# Patient Record
Sex: Female | Born: 1935 | Race: White | Hispanic: No | Smoking: Never smoker
Health system: Southern US, Community
[De-identification: ages and names within clinical notes are randomized; demographics above are authoritative.]

## PROBLEM LIST (undated history)

## (undated) DIAGNOSIS — D649 Anemia, unspecified: Secondary | ICD-10-CM

## (undated) DIAGNOSIS — T4145XA Adverse effect of unspecified anesthetic, initial encounter: Secondary | ICD-10-CM

## (undated) DIAGNOSIS — M1990 Unspecified osteoarthritis, unspecified site: Secondary | ICD-10-CM

## (undated) DIAGNOSIS — T8859XA Other complications of anesthesia, initial encounter: Secondary | ICD-10-CM

## (undated) DIAGNOSIS — I10 Essential (primary) hypertension: Secondary | ICD-10-CM

## (undated) DIAGNOSIS — E039 Hypothyroidism, unspecified: Secondary | ICD-10-CM

## (undated) DIAGNOSIS — N189 Chronic kidney disease, unspecified: Secondary | ICD-10-CM

## (undated) DIAGNOSIS — I219 Acute myocardial infarction, unspecified: Secondary | ICD-10-CM

## (undated) DIAGNOSIS — K219 Gastro-esophageal reflux disease without esophagitis: Secondary | ICD-10-CM

## (undated) HISTORY — PX: CARDIAC SURGERY: SHX584

## (undated) HISTORY — PX: CARDIAC VALVE REPLACEMENT: SHX585

## (undated) HISTORY — PX: CARDIAC CATHETERIZATION: SHX172

## (undated) HISTORY — PX: CERVICAL DISCECTOMY: SHX98

## (undated) HISTORY — PX: BACK SURGERY: SHX140

---

## 2002-10-13 ENCOUNTER — Ambulatory Visit (HOSPITAL_COMMUNITY): Admission: RE | Admit: 2002-10-13 | Discharge: 2002-10-13 | Payer: Self-pay | Admitting: Gastroenterology

## 2004-04-04 ENCOUNTER — Encounter: Admission: RE | Admit: 2004-04-04 | Discharge: 2004-04-04 | Payer: Self-pay | Admitting: Internal Medicine

## 2004-04-18 ENCOUNTER — Encounter: Admission: RE | Admit: 2004-04-18 | Discharge: 2004-04-18 | Payer: Self-pay | Admitting: Internal Medicine

## 2004-05-21 ENCOUNTER — Encounter: Admission: RE | Admit: 2004-05-21 | Discharge: 2004-05-21 | Payer: Self-pay | Admitting: Internal Medicine

## 2004-09-05 ENCOUNTER — Inpatient Hospital Stay (HOSPITAL_COMMUNITY): Admission: RE | Admit: 2004-09-05 | Discharge: 2004-09-12 | Payer: Self-pay | Admitting: Cardiothoracic Surgery

## 2004-09-27 ENCOUNTER — Encounter: Admission: RE | Admit: 2004-09-27 | Discharge: 2004-09-27 | Payer: Self-pay | Admitting: Cardiothoracic Surgery

## 2004-10-11 ENCOUNTER — Inpatient Hospital Stay (HOSPITAL_COMMUNITY): Admission: AD | Admit: 2004-10-11 | Discharge: 2004-10-24 | Payer: Self-pay | Admitting: Cardiothoracic Surgery

## 2004-10-12 ENCOUNTER — Encounter: Payer: Self-pay | Admitting: Cardiovascular Disease

## 2004-10-12 ENCOUNTER — Ambulatory Visit: Payer: Self-pay | Admitting: Cardiovascular Disease

## 2004-10-14 ENCOUNTER — Ambulatory Visit: Payer: Self-pay | Admitting: Hematology & Oncology

## 2004-10-15 ENCOUNTER — Encounter: Payer: Self-pay | Admitting: Cardiology

## 2004-10-16 ENCOUNTER — Encounter (INDEPENDENT_AMBULATORY_CARE_PROVIDER_SITE_OTHER): Payer: Self-pay | Admitting: *Deleted

## 2004-11-29 ENCOUNTER — Encounter: Admission: RE | Admit: 2004-11-29 | Discharge: 2004-11-29 | Payer: Self-pay | Admitting: Cardiothoracic Surgery

## 2005-11-14 ENCOUNTER — Encounter: Admission: RE | Admit: 2005-11-14 | Discharge: 2005-11-14 | Payer: Self-pay | Admitting: Internal Medicine

## 2010-02-01 ENCOUNTER — Encounter: Admission: RE | Admit: 2010-02-01 | Discharge: 2010-02-01 | Payer: Self-pay | Admitting: Internal Medicine

## 2011-01-04 NOTE — Op Note (Signed)
   NAME:  [name], [name]                          ACCOUNT NO.:  [id number]   MEDICAL RECORD NO.:  [id number]                   PATIENT TYPE:  AMB   LOCATION:  ENDO                                 FACILITY:  [facility]   PHYSICIAN:  Anselmo Rod, M.D.               DATE OF BIRTH:  1932-08-05   DATE OF PROCEDURE:  DATE OF DISCHARGE:                                 OPERATIVE REPORT   PROCEDURE PERFORMED:  Screening colonoscopy   ENDOSCOPIST:  Anselmo Rod, M.D.   INSTRUMENT USED:  Pediatric adjustable Olympus colonoscope.   INDICATIONS FOR PROCEDURE:  The patient is a 77 year old white female  undergoing screening colonoscopy. The patient has a previous history of  anemia. Rule out colonic polyps, masses, etc.   PREPROCEDURE PREPARATION:  Informed consent was secured from the patient.  The patient was fasted for eight hours prior to  the procedure and was  prepped with a bottle of MiraLax and Gatorade the night prior to the  procedure.   PREPROCEDURE PHYSICAL:  The patient had stable vital signs. Neck was supple.  Chest clear to auscultation. S1, S2 regular. Abdomen soft with normal bowel  sounds.   DESCRIPTION OF PROCEDURE:  The patient was placed in the left lateral  decubitus position and sedated with 50 mg of Demerol and 5 mg of Versed  intravenously. Once the patient was adequately sedated and maintained on low  flow oxygen  and continuous cardiac monitoring, the Olympus video  colonoscope was advanced into the rectum to the cecum with difficulty.   The patient had a healthy appearing colon with no evidence of masses,  polyps, diverticulosis, erosions or ulcerations. The appendiceal orifice and  the ileocecal valve were clearly visualized and photographed and no  abnormalities were noted for the colonic mucosa. A small internal hemorrhoid  was seen on retroflexion into the rectum.   IMPRESSION:  1. Normal appearing colon up to the cecum.  2. Small nonbleeding internal  hemorrhoid seen on retroflexion.   RECOMMENDATIONS:  1. A high-fiber diet with liberal fluid intake has been advocated.  2.     Repeat colorectal cancer screening is recommended in the next 10 years     unless the patient develops any abnormal symptoms in the interim.  3. The patient is to follow up on a p.r.n. basis.                                               Anselmo Rod, M.D.    JNM/MEDQ  D:  10/13/2002  T:  10/13/2002  Job:  [id number]   cc:   Ralene Ok, M.D.  [address]  [place]  [place] [address]  Fax: [phone]

## 2011-01-04 NOTE — Op Note (Signed)
[name], Nancy                ACCOUNT NO.:  [id number]   MEDICAL RECORD NO.:  [id number]          PATIENT TYPE:  INP   LOCATION:  2314                         FACILITY:  [facility]   PHYSICIAN:  Bedelia Person, M.D.        DATE OF BIRTH:  Dec 17, 1931   DATE OF PROCEDURE:  09/05/2004  DATE OF DISCHARGE:                                 OPERATIVE REPORT   PROCEDURE:  Transesophageal echocardiography.   OPERATOR:  Bedelia Person, M.D.   ANESTHESIA:  General.   INDICATIONS FOR PROCEDURE:  This is a 77 year old with known significant  mitral regurgitation, scheduled at that time for mitral valve replacement or  repair.  The patient was questioned about possible esophageal disorders, and  denies esophageal or gastric disorders in the past.  The TEE will be used  intraoperatively to assess repair or replacement.   DESCRIPTION OF PROCEDURE:  After induction with general anesthesia, the  airway was secured with an oral endotracheal tube.  The stomach was  suctioned with an orogastric tube which was then removed.  The  transesophageal transducer was heavily lubricated, passed in a sleeve,  sleeve was lubricated and sleeve and transducer were passed blindly down the  oropharynx without difficulty, to the 40 cm mark.  There it remained and was  manipulated throughout the remainder of the case, to obtain various  Omniplane views.   At the completion of the case, the probe and sleeve were removed, there was  no evidence for oropharyngeal damage or facial tattooing.   PRE-BYPASS EXAMINATION:  Left ventricle hyperdynamic, there were no  segmental defects.  The left atrium slightly enlarged, the appendage was  clean. The atrioseptum showed a small, left to right regurgitant jet into  the right atrium suggestive of PFO.  The mitral valve showed gross prolapse  of the middle portion of the anterior leaflet.  There was a free cord noted  off this segment.  Posterior leaflet appeared normal. Color Doppler  revealed  4+ mitral regurgitation with a broad, regurgitant flow jet.  Flow reversal  could be documented in the left pulmonary vein.   The aortic valve had 3 leaflets, they opened and closed appropriately. There  was a single area of calcification.  There was no significant aortic  insufficiency.   Patient was placed on cardiopulmonary bypass and underwent mitral valve  replacement.  At the completion of the bypass, numerous air bubbles were  noted which were removed with a left ventricular vent.  The post-bypass exam  showed the left ventricle to initially show some hypokinesis of the entire  septal wall. This improved over a short time after bypass to its normal,  hyperdynamic, pre-bypass state. There were no segmental defects.   The left atrium and septum:  The appendage was no longer visible, which was  probably tied off during surgery. The left to right shunt had resolved with  sutures in the septum noted.  The mitral valve had a ring which was well  seated.  Sutures were intact.  The leaflets appeared to be coapting well.  There was a small,  free segment flailing off the mid-anterior leaflet. The  anterior leaflet appeared significantly smaller than the pre-bypass  examination. Color Doppler  showed turbulent flow above the posterior leaflet in the area of the absent  appendage, and in the area of the pulmonary vein, which was probably  secondary to dynamic flow.  There was a trace, central regurgitant flow  through the mitral valve.  The aortic valve was unchanged from pre-bypass  examination.       LK/MEDQ  D:  09/05/2004  T:  09/05/2004  Job:  [id number]   cc:   Sheliah Plane, MD  [address]  [place]  [place] [address]   Judithe Modest, Dr.

## 2011-01-04 NOTE — Consult Note (Signed)
[name], [name]                ACCOUNT NO.:  [id number]   MEDICAL RECORD NO.:  [id number]          PATIENT TYPE:  INP   LOCATION:  2022                         FACILITY:  [facility]   PHYSICIAN:  Rose Phi. Myna Hidalgo, M.D. 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   HISTORY OF PRESENT ILLNESS:  [name] is a very nice 77 year old white  female who was discharged back on September 12, 2004 by Dr. Tyrone Sage.  She is  diagnosed with severe mitral valve regurgitation.  Subsequently underwent  repair of mitral valve with a Gore-Tex wing, with Gore-Tex Cordis.   There was no problems postoperatively from Dr. Dennie Maizes point of view.  She had done well.  On September 11, 2004, a CBC showed a white cell count of  6.7, hemoglobin 11.6, hematocrit 34.1 and platelet count was 241.  She was  on Coumadin as an outpatient.   She went in to see Dr. Tyrone Sage.  She was complaining of weakness.  She  apparently had jaundice.  She was seen in his office.  She subsequently  was admitted on October 11, 2004.   On admission, CBC showed a white cell count of 8.5, hemoglobin 5.3,  hematocrit 15 and platelet count 215.  Metabolic panel showed a bilirubin of  2.5.  Albumin 15, creatinine 1.4.  LDH was 1956.  A reticulocyte was done,  which was 10%.  Haptoglobin was less than 6.  She had a Coombs test which  was negative.   She was given two units of packed red blood cells.  On October 12, 2004,  CBC shows a white cell count 8.7, hemoglobin 8.3, hematocrit 23.5, platelet  count 211.  A urinalysis was done, which did show large amount of bilirubin  and blood.  She was positive for nitrates and leukocytes.  She had a  prothrombin time of 20.1 today.   We were asked to see her because  of hemolysis.  She had a blood smear  reviewed; which was consistent with a microangiopathic process, with  numerous schistocytes seen.   She has not had any headache or blurred vision.  There has been no bony  pain.  She has had no diarrhea.  She has not had any unusual food.  She has  had no bleeding otherwise.   PAST MEDICAL HISTORY:  1.  Hypertension.  2.  Hyperlipidemia.  3.  Hypothyroidism.   ALLERGIES:  None.   ADMISSION MEDICATIONS:  1.  Aspirin 81 mg p.o. q.d.  2.  Micardis 80 mg q.d.  3.  Pravachol 40 mg q.d.  4.  Synthroid 0.05 mg q.d.  5.  Coumadin 2 mg __________ and 2.5 mg q.d.  6.  Folic acid.   SOCIAL HISTORY:  Negative for tobacco or alcohol use.  She had no obvious  occupational exposures.   FAMILY HISTORY:  Unremarkable for any type  of anemia or blood problem.   PHYSICAL EXAMINATION:  GENERAL:  She is a fairly well developed, well  nourished white female; in no obvious distress.  VITAL SIGNS:  Temperature 98.1, pulse 68, respiratory rate 20, blood  pressure 144/60.  HEENT:  Shows normocephalic and atraumatic skull.  I really cannot detect  any scleral icterus.  Conjunctivae was slightly pale.  Oral lesions were not  noted.  NECK:  There is no adenopathy in the neck.  LUNGS:  Clear bilaterally.  CARDIAC:  Regular rate and rhythm with a 3/6 systolic  ejection murmur.  This is best heard over the apex.  ABDOMEN:  Shows a soft abdomen with good bowel sounds. There is no palpable  abdominal mass.  There is no palpable hepatosplenomegaly.  BACK:  Shows no tenderness over the spine, ribs or hips.  EXTREMITIES:  Shows no clubbing, cyanosis or edema.  NEUROLOGIC:  Shows no focal neurological deficits.   IMPRESSION:  [name] is a 77 year old female with recent mitral valve  repair.  She now has severe hemolytic anemia.  This is non-immune.  Therefore, one has to highly suspect this being mitral valve repair.  There  are very few etiologies for non  autoimmune hemolysis.  She certainly does  not have TTP or HUS.  There is no evidence to suggest any type of infectious  etiology; she is not septic.  There is no evidence to support GIC.  She has  no evidence to support PNH.  She does not have any obvious known enzyme  deficit, which can lead to hemolysis.  She is not on any medication that  would lead to hemolysis, due to enzyme deficiency (i.e., G6PD deficiency,  etc).   Unfortunately I think there is little that can be done in this situation.  As long as the insult is present, then hemolysis will continue.  The best  thing we can do is to keep her on folic acid.  She will certainly need to be  transfused.  Would probably try to reverse her anticoagulation if possible,  and then put her on some type of a low dose anticoagulation.   She will have to her CBC followed daily, and transfuse as needed.   Will follow her along.  Thanks for allowing [place] to see [name].  She is a  nice lady.      PRE/MEDQ  D:  10/12/2004  T:  10/14/2004  Job:  [id number]

## 2011-01-04 NOTE — Op Note (Signed)
[name], [name] NO.:  [id number]   MEDICAL RECORD NO.:  [id number]          PATIENT TYPE:  INP   LOCATION:  2312                         FACILITY:  [facility]   PHYSICIAN:  Sheliah Plane, MD    DATE OF BIRTH:  1932/05/20   DATE OF PROCEDURE:  10/16/2004  DATE OF DISCHARGE:                                 OPERATIVE REPORT   PREOPERATIVE DIAGNOSIS:  Recent mitral valve repair with recurrent mitral  regurgitation and hemolysis.   POSTOPERATIVE DIAGNOSIS:  Recent mitral valve repair with recurrent mitral  regurgitation and hemolysis.  Flail of portion of the anterior leaflet  secondary to ruptured artificial cords and partial dehiscence of the mitral  ring.   PROCEDURE:  Redo sternotomy and mitral valve replacement with a [employer] pericardial tissue valve 29 mm, model 6900T, serial number [id number].   SURGEON:  Sheliah Plane, M.D.   BRIEF HISTORY:  Patient is a 77 year old female who, approximately five  before, was noted to have normal coronary arteries with severe mitral  regurgitation.  At the time of surgery, the patient had repair of the  anterior leaflet with placement of Gore-Tex cords three pairs, and had a  repair without significant mitral regurgitation.  She initially did well  after surgery and was discharged.  The week prior to return to the hospital,  began feeling increasing weak and short of breath.  Evaluation showed  elevated bilirubin and hemoglobin had fallen to 5 with schistocytes present.  Laboratory worked up for hemolysis confirmed some mechanical hemolysis.  Echocardiogram  and TEE were performed and the TEE confirmed recurrent  severe mitral regurgitation.  Because of the hemolysis and regurgitation, it  was presumed that the patient had breakdown of the mitral repair and  reoperation was recommended.  Patient agreed and signed informed consent.   DESCRIPTION OF PROCEDURE:  With Swan-Ganz and arterial line monitors in  place, the patient underwent general endotracheal anesthesia without  incident.  Skin of the chest and legs was prepped with Betadine and draped  in usual sterile manner.  The previous incision was reopened as was the  sternum divided.  The sternum was well healed.  The underlying tissue was  dissected off the sternum.  This was particularly difficult because of the  amount of inflammatory response especially around the anterior right  ventricle and the right atrium and ascending aorta.  With dissection in this  area completed, the patient was systemically heparinized.  The ascending  aorta was cannulated, superior and internal vena caval cannulas were placed.  Metal tip cannula was placed directly into the superior vena cava and into  the inferior vena cava.  A retrograde cardioplegia catheter was placed.  Ascending aorta root bent cardioplegia needle was introduced.  Patient was  placed on cardiopulmonary bypass.  Aortic crossclamp was applied, 500 mL of  cold blood potassium  cardioplegia was administered antegrade.  In addition,  intermittently, retrograde cardioplegia was used.  Intra-atrial groove was  opened with proper retraction.  The valve was inspected.  There was a  partial dehiscence of the valve  ring over one stitch just above the  posterior commissure.  One of the three artificial cordae was dehisced.  In  addition, these cords were removed.  Each of the three cords were carefully  inspected and the small pledgets that had been used to place these in the  __________ muscles were well seated and tied in place so we did not make an  attempt to remove them.  It was decided that considering the patient's age  and reoperation that rather than attempting a second repair, that the valve  would be replaced with a pericardial tissue valve.  The anterior leaflet was  excised.  The posterior leaflet was preserved.  The annulus was sized for a  #29 pericardial tissue valve.  A #2 Ticron  pledgeted suture placed  circumferentially through the annulus.  Valve was then secured in place  taking care to place the valve in a way that the struts would not interfere  with the outflow tract.  The valve was secured in place and seated well.  The patient's body temperature was rewarmed.  The heart was allowed to  passively fill and deair as the left atriotomy was closed with a running 3-0  Prolene.  The aortic crossclamp was removed with a total crossclamp time of  71 minutes.  The patient spontaneously converted to a sinus rhythm with  removal of the crossclamp. The remained hemodynamically stable.  With the  transesophageal echocardiogram, it was obvious that the mitral valve was  without leakage and it was well seated.  With the patient's body temperature  rewarmed to 37 degrees, she was then ventilated and weaned from  cardiopulmonary bypass  without difficulty.  She remained hemodynamically  stable, was decannulated in the usual fashion.  Protamine sulfate was  administered.  With the operative field hemostatic after administration of  platelets and fresh frozen because the patient had a prolonged bleeding time  and slightly prolonged protime preoperatively.  The patient remained  hemodynamically stable.  Two mediastinal tubes were left in place.  The  sternum was closed with #6 stainless steel wires.  Fascial closed with  interrupted 0 Vicryl, running 3-0 Vicryl in subcutaneous tissue, 4-0  subcuticular stitch in skin edges.  Dry dressings were applied.  Sponge and  needle counts were reported as correct at the completion of the procedure.  The patient tolerated the procedure without obvious complications and was  transferred to the surgical intensive care unit for further postoperative  care.  Total pump time was 92 minutes.  The patient did require banked red  blood cells during the procedure because of the low hematocrit while on bypass.  The patient tolerated the procedure  without obvious complications  and was transferred to surgical intensive care unit for further  postoperative care.  Pericardial tissue valve implanted was model 6900T,  serial number [id number], 29 mm [employer].      EG/MEDQ  D:  10/16/2004  T:  10/16/2004  Job:  [id number]   cc:   Dr. Thomes Lolling  [facility]  [place], [place]

## 2011-01-04 NOTE — Op Note (Signed)
[name], [name]                ACCOUNT NO.:  [id number]   MEDICAL RECORD NO.:  [id number]          PATIENT TYPE:  INP   LOCATION:  2314                         FACILITY:  [facility]   PHYSICIAN:  Sheliah Plane, MD    DATE OF BIRTH:  [birth year]   DATE OF PROCEDURE:  09/05/2004  DATE OF DISCHARGE:                                 OPERATIVE REPORT   PREOPERATIVE DIAGNOSIS:  Severe mitral regurgitation.   POSTOPERATIVE DIAGNOSIS:  Severe mitral regurgitation.   SURGICAL PROCEDURE:  Repair of mitral valve with placement of Gore-Tex  Cordis on metal scallop of the anterior leaflet and placement of a mitral  valve annuloplasty ring, [employer], Model [id number], serial [id number].  Also, sutured closure of left atrial appendage and closure of patent foramen  ovale.   SURGEON:  Sheliah Plane, MD   BRIEF HISTORY:  The patient is a 77 year old female who presented with  several months of increasing shortness of breath and a harsh murmur of  mitral insufficiency.  She was evaluated by Dr. Thomes Lolling in [facility],  including TEE and cardiac catheterization, which demonstrated a flail  portion of the anterior leaflet of the mitral valve, preserved LV function.  Coronary arteries were without significant disease.  When initially seen,  the patient had some dental infections.  She was seen and evaluated by  dentistry and ultimately, these were cleared up and patient was cleared to  proceed with surgery.  Mitral valve repair or replacement had been  recommended to the patient, who agreed and signed informed consent.   DESCRIPTION OF PROCEDURE:  With Swan-Ganz and arterial lines in place, the  patient underwent general endotracheal anesthesia without incidence.  The  skin, legs, and chest prepped with Betadine and draped in the usual sterile  manner.  A transesophageal echo probe was placed.  A separate dictation  describes this, but as the preop echo showed, patient had severe mitral  regurgitation with flail portion of the anterior leaflet.  The posterior  leaflet appeared to be intact. Also confirmed was a patent foramen ovale.  Patient was in sinus rhythm.  A median sternotomy was performed.  The  patient was systemically heparinized.  The ascending aorta was cannulated.  Superior and inferior vena caval cannulae were placed.  Tapes were placed  around the superior and inferior vena cavae.  During the process of  cannulation, the patient had episodic atrial fibrillation, and because of  the risks of postop atrial fibrillation, was started on an amiodarone drip.  Patient is placed on cardiopulmonary bypass at 2.4 L/min/m2.  Patient's body  temperature is cooled to 30 degrees.  An aortic root bent-cardioplegic  needle was placed into the ascending aorta.  Retrograde cardioplegia  catheter was also placed through a separate opening in the right atrium.  Aortic cross clamp was applied, and 500 cc of cold blood cardioplegia was  administered antegrade.  The atrium was then opened along the intra-atrial  groove with proper retraction.  This gave excellent visualization of the  mitral valve.  The posterior leaflet appeared normal at P1, P2, and P3.  The  anterior leaflet appeared normal at P1 and P2, A1 and A3.  In the middle  portion of A2, there was an area of ruptured cord and an area of thickening  of the free edge of the leaflet.  The remainder of the leaflet was of normal  consistency.  Consideration for transfer of a portion of the posterior  leaflet to the anterior leaflet versus primary repair of the anterior  leaflet with placement of Gore-Tex cords, each were considered.  Repaired  using 5-0 Gore-Tex cords through the papillary muscles and then through the  free edge of the flail portion of the anterior leaflet and then up on to the  leaflet and then tied.  Provided adequate coaptation of the anterior and  posterior leaflets.  The annulus, intertrochanteric  distance, and size of  the anterior leaflet were all evaluated, and a Seguin annuloplasty ring, [employer], Model #SARP-26, serial [id number], was selected.  Using #2  Tycron sutures along the annulus were used to secure the ring in place.  With the ring securely in place, passive filling of the leaflet resulted in  adequate coaptation.  The left atrial appendage was over-sewn with a running  4-0 Prolene in a double-layered fashion.  On echocardiogram, the patient had  a patent foramen with slight elevation.  The patent foramen was easily  identified from the left atrial side.  It was closed with a running 4-0  Prolene suture.  The left atriotomy was closed with a horizontal mattress  and 3-0 Prolene suture.  Prior to complete closure of the atrium, the head  was put in a down position.  The heart was allowed to passive fill and  deair.  The atriotomy was completed.  A second layer of running 3-0 Prolene  was used to reinforce the closure.  Throughout the procedure, intermittent  retrograde cardioplegia had been used.  Warm cardioplegia was administered.  The retrograde aortic cross-clamp was then removed with a total cross clamp  time of 88 minutes.  The patient required electrodefibrillation, turned to a  sinus rhythm.  Atrial and ventricular pacing wires were applied with the  patient's body temperature rewarmed to 37 degrees.  Further deairing  maneuvers were carried out, and a 16-gauge needle was introduced into a left  ventricular apex to further deair the heart.  With the patient rewarmed to  37 degrees, she was then ventilated and weaned from cardiopulmonary bypass.  Before complete separation from bypass, transesophageal echo probe was used  to interrogate the mitral valve apparatus.  There less than trace mitral  regurgitation with good coaptation of the anterior and the posterior  leaflets.  It appeared to be a satisfactory repair.  The patient was then separated from  bypass, and again, TEE confirmed less than trace mitral  regurgitation.  The patient remained hemodynamically stable and was  decannulated in the usual fashion.  Protamine sulfate was administered with  operative field hemostatic.  The pericardium was loosely reapproximated.  Two mediastinal tubes left in place.  The sternum was closed with a #6  stainless steel wire.  The fascia was closed with interrupted 0 Vicryl,  running 3-0 Vicryl, and subcutaneous tissue, and 4-0 subcuticular stitches  in the skin edges.  Dry dressings were applied.  Sponge and needle count was  reported as correct at the completion of the procedure.  The patient  tolerated the procedure without obvious complication and was transferred to  the surgical intensive care unit for  further postoperative care.      [place]   EG/MEDQ  D:  09/05/2004  T:  09/05/2004  Job:  [id number]   cc:   Gabrielle Dare, M.D.  [facility]  [place], [place]

## 2011-01-04 NOTE — Discharge Summary (Signed)
[name], [name]                ACCOUNT NO.:  [id number]   MEDICAL RECORD NO.:  [id number]          PATIENT TYPE:  INP   LOCATION:  2035                         FACILITY:  [facility]   PHYSICIAN:  Sheliah Plane, MD    DATE OF BIRTH:  1932/08/09   DATE OF ADMISSION:  10/11/2004  DATE OF DISCHARGE:                                 DISCHARGE SUMMARY   ADMISSION DIAGNOSES:  1.  Recurrent mitral regurgitation.  2.  Hemolysis.   PAST MEDICAL HISTORY AND DISCHARGE DIAGNOSES:  1.  Mitral regurgitation, status post mitral valve repair on September 05, 2004.  2.  Recurrent mitral regurgitation with hemolysis and anemia, status post      mitral valve replacement with a pericardial tissue valve on October 16, 2004.  3.  Hypertension.  4.  Hyperlipidemia.  5.  Hypothyroid, status post thyroidectomy for a goiter.  6.  Status post tonsillectomy.  7.  Status post cataract surgery.  8.  Postoperative bradycardia.   ALLERGIES:  No known drug allergies.   BRIEF HISTORY:  The patient is a 77 year old female with a history of mitral  regurgitation, who underwent mitral valve repair by Dr. Tyrone Sage on September 05, 2004.  The patient had a repair of the anterior leaflet with placement  of Gore-Tex cords in three pairs and a repair without significant mitral  regurgitation postoperatively.  The patient did well after surgery and was  discharged without complications.  The week prior to her return to the  hospital, she began to feel increasing symptoms of weakness and shortness of  breath.  She presented to her cardiologist, who had been following her  Coumadin levels and evaluation also revealed elevated bilirubin, hemolysis  and anemia with a hemoglobin of 5.  The patient was referred to Dr. Tyrone Sage  after she underwent a transthoracic echocardiogram which showed recurrent  mitral valve regurgitation.  Dr. Tyrone Sage evaluated the patient and decided  to admit her for treatment of her  hemolysis, as well as a TEE.   HOSPITAL COURSE:  The patient was admitted on October 11, 2004, as  previously stated.  She was maintained on routine hospital care and was  transfused with two units of packed red blood cells secondary to a  hemoglobin of 5.  Blood work revealed hemolysis with schistocytes present  and an increased reticulocyte count.  Hemolysis was found to be secondary to  recurrent mitral regurgitation.  The patient's Coumadin was held.   A hematology-oncology consultation was obtained from [facility] Myna Hidalgo, M.D.,  on October 12, 2004.  Dr. Myna Hidalgo stated that the etiology of the patient's  hemolysis was in fact due to the mitral valve regurgitation.  He recommended  keeping her on folic acid and transfusing her as needed.  The patient  underwent a TEE on October 15, 2004, which confirmed recurrent severe  mitral regurgitation.  Secondary to the patient's recurrent regurgitation in  the presence of hemolysis, she was scheduled for mitral valve replacement.   The patient was taken to the OR on  October 16, 2004, for a redo sternotomy  and mitral valve replacement with an [employer] pericardial tissue  valve 29 mm by Dr. Tyrone Sage.  The patient tolerated the procedure well and  was hemodynamically immediately postoperatively.  She was transferred from  the OR to the SICU in stable condition.  The patient was extubated without  complication and woke up from anesthesia neurologically intact.   The patient's postoperative course has progressed as expected.  On  postoperative day #1, the patient was awake and alert with stable vital  signs and maintaining a bradycardia.  She was apaced.  The chest tubes and  invasive lines were discontinued in a routine manner and she tolerated this  well.  All drips were also discontinued in a routine manner.  The patient  began cardiac rehabilitation on postoperative day #1 and has tolerated this  quite well postoperatively.   The patient has been volume overloaded  postoperatively and has been diuresed accordingly.  She was restarted on her  Coumadin on postoperative day #3.  This will be maintained and monitored to  achieve an INR of 2-2.5 for approximately eight weeks secondary to mitral  valve replacement.  The patient was again transfused an additional unit of  packed red blood cells on postoperative day #3 secondary to a hematocrit of  22.4.   The remainder of the patient's postoperative course was as expected.  The  external pacer was decreased and then stopped, again without complication.  She is maintaining a slow sinus rhythm.  On postoperative day #7, she is  afebrile with stable vital signs.  She is below her preoperative weight and  therefore diuresis is no longer needed.  She did complain of some weakness,  however, was beginning to feel better in the afternoon.  The wounds are  healing well and there is no murmur auscultated.  She is in stable condition  at this time.  As long she continues to progress in the current manner, she  will be stable for discharge within the next one to two days pending morning  round reevaluation.   CONDITION ON DISCHARGE:  Improved.   DISCHARGE MEDICATIONS:  1.  Pravachol 40 mg daily.  2.  Levoxyl 50 mcg daily.  3.  Iron 325 mg b.i.d.  4.  Folic acid 1 mg daily.  5.  Coumadin 2.5 mg daily and then as directed.  6.  Ultram 50 mg one to two p.o. q.4-6h. p.r.n. pain.   ACTIVITY:  No driving.  No lifting more than 10 pounds.  The patient is to  continue daily breathing and walking exercises.   DIET:  Low salt, low fat.   WOUND CARE:  The patient may shower daily and clean the incisions with soap  and water.  If wound problems arise, she will be instructed to contact the  CVTS office at [phone].   FOLLOWUP APPOINTMENTS:  1.  PT and INR blood work is to be drawn on October 26, 2004, by Dr. Jillyn Hidden     office.  She will be instructed to contact his office for the  time of      this appointment.  2.  With Dr. Jillyn Hidden.  The patient is to contact his office for an appointment      two weeks after discharge.  3.  Dr. Tyrone Sage on November 15, 2004, at 11:20 a.m.  She will be asked to      bring the chest x-ray from the appointment with Dr.  Fulp to the      appointment with Dr. Tyrone Sage.      AY/MEDQ  D:  10/23/2004  T:  10/23/2004  Job:  [id number]   cc:   Cammie Fulp  [id number]  [place]  [place] [address]  Fax: [phone]

## 2011-01-04 NOTE — Op Note (Signed)
NAMESALVADOR, BIGBEE                ACCOUNT NO.:  [id number]   MEDICAL RECORD NO.:  [id number]          PATIENT TYPE:  INP   LOCATION:  2312                         FACILITY:  [facility]   PHYSICIAN:  Bedelia Person, M.D.        DATE OF BIRTH:  1932-05-15   DATE OF PROCEDURE:  10/16/2004  DATE OF DISCHARGE:                                 OPERATIVE REPORT   PROCEDURE:  Intratransesophageal echocardiography.   Ms. Lundeen is an elderly female, who had a mitral valve repair  approximately 1 month ago.  At this time, she is exhibiting significant  mitral regurgitation symptoms and is scheduled for mitral valve repair or  replacement.  The TEE will be used to assess the extent of the repair or  replacement intraoperatively.   After induction with general anesthesia, the airway was secured with an  endotracheal tube.  The stomach was suctioned with an oral gastric tube  which was then removed.  The transesophageal probe was heavily lubricated,  placed in a sleeve.  The sleeve was lubricated, and then the sleeve and  transducer were passed down the oropharynx blindly without difficulty.  A  probe was left in the neutral unflexed position in the bypass.  At the  completion of the case, the probe was removed.  There was no evidence of  oral or pharyngeal damage.  Prebypass examination revealed the left  ventricle to be concentrically thickened.  There was good contractility, no  segmental defects.  Left atrium was slightly enlarged.  There was no  evidence of a septal defect.  The mitral valve revealed the ring to be  seated well.  There was a flailed cord segment noted anterior to the  anterior leaflet.  There was slight prolapsing of the anterior leaflet.  Color Doppler revealed 3+ mitral regurgitation with the regurgitant jet  directed over the posterior leaflet.  There was also a smaller posterior  perivalvular regurgitant jet detected.  The aortic valve was normal.  Right  heart exam was normal.   The patient was placed on cardiopulmonary bypass and  underwent replacement of the mitral valve.  At the completion of the  procedure, numerous air bubbles were cleared with the left ventricular vent.  The left ventricular pulse bypass was unchanged from the prebypass  examination.  The prostatic mitral valve appeared to be well-seated.  Leaflets were open and closing appropriately.  There was no SAM of the  anterior leaflet.  Color Doppler revealed a trace mitral regurgitant flow at  the point of leaflet coaptation in a central pattern.  There was also a  trace regurgitant jet near the aortic valve.  The remainder of the exam was  unchanged from prebypass examination.      LK/MEDQ  D:  10/16/2004  T:  10/16/2004  Job:  [id number]

## 2011-01-04 NOTE — Discharge Summary (Signed)
NAMEELONA, YINGER                ACCOUNT NO.:  [id number]   MEDICAL RECORD NO.:  [id number]          PATIENT TYPE:  INP   LOCATION:  2020                         FACILITY:  [facility]   PHYSICIAN:  Sheliah Plane, MD    DATE OF BIRTH:  07-10-1932   DATE OF ADMISSION:  09/05/2004  DATE OF DISCHARGE:  09/11/2004                                 DISCHARGE SUMMARY   ANTICIPATED DATE OF DISCHARGE:  September 11, 2004.   ADMISSION DIAGNOSIS:  Severe mitral valve regurgitation.   PAST MEDICAL HISTORY:  1.  Hypertension.  2.  Hyperlipidemia.  3.  Hypothyroidism, status post thyroidectomy for goiter.   OTHER SURGICAL HISTORY:  1.  Tonsillectomy.  2.  Cataract surgery.   ALLERGIES:  This patient has no known drug allergies.   DISCHARGE DIAGNOSIS:  Severe mitral valve regurgitation, status post mitral  valve repair.   BRIEF HISTORY:  Mrs. Dorantes is a 77 year old Caucasian female.  She was  originally evaluated at our [facility] [place] office by  Sheliah Plane, M.D., on July 26, 2004.  At that time, she had an  approximate six-month history of increasing shortness of breath with  exertion associated with chest tightness.  Her symptoms have increased.  On  physical exam, she had a harsh murmur of mitral insufficiency.  She  underwent a cardiac catheterization at [facility] in  December of 2005.  This revealed preserved LV function, normal coronary  arteries and severe mitral valve regurgitation.  Transesophageal  echocardiography demonstrated a flail portion of the anterior leaflet of the  mitral valve.  She was referred to Dr. Tyrone Sage for consideration of  surgical intervention.  After examination of the patient and review of all  available records, Dr. Tyrone Sage agreed with proceeding with mitral valve  repair or replacement was the preferred treatment choice for this woman.  On  physical exam, she was noted to have several bad areas in her teeth and  Dr.  Tyrone Sage recommended proceeding with dental consultation prior to proceeding  with mitral valve surgery.  Mrs. Cogliano did this and then returned to the  CVTS office on August 30, 2004.  She had abscessed teeth which were treated  and has been cleared from a dental point of view to proceed with cardiac  surgery.  Dr. Tyrone Sage again reviewed the procedure risks and benefits of  mitral valve repair and/or replacement.  Mrs. Schraeder agreed to proceed with  surgery.   HOSPITAL COURSE:  On September 05, 2004, Mrs. Lean was electively admitted  to [facility] under the care of Sheliah Plane, M.D.  She  underwent the following surgical procedures:  1.  Repair of mitral valve  with placement Gore-Tex Cordis on medial scallop of the anterior leaflet and  placement of a mitral valve annuloplasty ring, [employer], model  [place]ing, serial [id number].  2.  Sutured closure of left atrial appendage and  closure of patent foramen ovale.  Mrs. Heims tolerated these procedures,  transferring in stable condition to the SICU.  She remained hemodynamically  stable in the immediate  postoperative period and was extubated in the early  morning hours of postoperative day #1.  She awoke from anesthesia  neurologically intact.  She required only routine care in the intensive care  unit and was ready for transfer to unit 2000 on postoperative day #2.  Coumadin therapy was started on postoperative day #2.  She did have an  intrinsic slow heart rate in the first 48 hours after surgery and her rate  was supported via temporary pacemaker.  By postoperative day #3, she was  noted to be in sinus rhythm, approximately 60 beats per minute under her  pacer and the pacer was turned off.  She did have a brief episode of atrial  fibrillation on postoperative day #3, but she spontaneously returned to  normal sinus rhythm and has maintained sinus rhythm since that time.  Because of her slow heart rate,  currently sinus rhythm at 68 beats per  minute, she has not been started on beta blocker therapy after surgery.   Overall Mrs. Hebard is making good progress in recovering from her surgery.  Today, September 10, 2004, postoperative day #5, morning rounds, she reports  feeling fairly well.  Her vital signs are stable with blood pressure 127/62.  She is afebrile.  Room saturations are 96%.  Her heart is maintained in  normal sinus rhythm at 68 beats per minute.  Her lungs are clear to  auscultation.  She is tolerating her diet without nausea.  Urine output has  been excellent.  She is just about at her preoperative weight at 114.1  pounds.  Her chest incision is healing well.  She has no lower extremity  edema.  She has been ambulating with cardiac rehabilitation services.  Her  distances are increasing with each walk and she is requiring less assistance  with each walk.  Her pain is adequately controlled.  If Mrs. Chalmers  continues to progress in this manner, it is anticipated she will be ready  for discharge home in the next 24-48 hours as her INR approaches therapeutic  level.   LABORATORY STUDIES:  On September 10, 2004, chemistries included sodium 142,  potassium 4.6, BUN 17, creatinine 1.1 and glucose 101.  PT 15.6, INR 1.4.  On September 08, 2004, CBC with white blood cell count 10.5, hemoglobin 10.2,  hematocrit 30.0 and platelets 89.   CONDITION ON DISCHARGE:  Improved.   DISCHARGE MEDICATIONS:  1.  Enteric-coated aspirin 81 mg daily.  2.  Micardis 80 mg daily.  3.  Pravachol 40 mg daily.  4.  Levoxyl 50 mcg daily.  5.  Albuterol inhaler p.r.n.  6.  Os-Cal 500 mg t.i.d.  7.  Fosamax weekly.  8.  Coumadin, final dose to be determined at discharge, probably 2.5 mg      daily.  9.  Folic acid 1 mg daily.  10. For pain management, she may have Tylox one to two p.o. q.4h. p.r.n. for      moderate to severe pain or Tylenol 325 mg one to two p.o. q.4h. p.r.n.      for mild  pain.  ACTIVITY:  She is to refrain from any driving, heavy lifting, pushing or  pulling.  Also instructed to continue her breathing exercises and daily  walking.   DIET:  She will continue to be on a low-fat, low-salt diet.   WOUND CARE:  She is to shower daily with mild soap and water.  If her  incision shows any sign of infection, she  is to call Dr. Dennie Maizes office.   FOLLOWUP:  She is to have PT and INR blood work 48 hours after discharge at  [facility].  She has been asked to call to arrange a time for this  appointment.  Dr. Thomes Lolling should see Mrs. Koke back in the office in  approximately two weeks.  Again Mrs. Whitcher has been asked to call to  arrange that appointment.  Dr. Tyrone Sage would like to see Mrs. Carapia back  at the CVTS office on Thursday, September 27, 2004, at 12 p.m.  She will be  asked to have a chest x-ray at [facility] at 11 a.m. that morning and bring the  film for Dr. Tyrone Sage to review.      CTK/MEDQ  D:  09/10/2004  T:  09/10/2004  Job:  [id number]   cc:   Gabrielle Dare, MD  [facility]  [place], [place]   Ralene Ok, MD  [place], [place]

## 2013-02-17 ENCOUNTER — Ambulatory Visit
Admission: RE | Admit: 2013-02-17 | Discharge: 2013-02-17 | Disposition: A | Discharge status: Home / Self Care | Payer: Medicare Other | Source: Ambulatory Visit | Attending: Internal Medicine | Admitting: Internal Medicine

## 2013-02-17 ENCOUNTER — Other Ambulatory Visit: Payer: Self-pay | Admitting: Internal Medicine

## 2013-02-17 DIAGNOSIS — R509 Fever, unspecified: Secondary | ICD-10-CM

## 2013-02-17 DIAGNOSIS — R0789 Other chest pain: Secondary | ICD-10-CM

## 2013-02-17 DIAGNOSIS — R05 Cough: Secondary | ICD-10-CM

## 2013-03-17 ENCOUNTER — Ambulatory Visit
Admission: RE | Admit: 2013-03-17 | Discharge: 2013-03-17 | Disposition: A | Discharge status: Home / Self Care | Payer: Medicare Other | Source: Ambulatory Visit | Attending: Internal Medicine | Admitting: Internal Medicine

## 2013-03-17 ENCOUNTER — Other Ambulatory Visit: Payer: Self-pay | Admitting: Internal Medicine

## 2013-03-17 DIAGNOSIS — R9389 Abnormal findings on diagnostic imaging of other specified body structures: Secondary | ICD-10-CM

## 2018-11-16 ENCOUNTER — Encounter (HOSPITAL_COMMUNITY): Payer: Self-pay | Admitting: Emergency Medicine

## 2018-11-16 ENCOUNTER — Other Ambulatory Visit: Payer: Self-pay

## 2018-11-16 ENCOUNTER — Inpatient Hospital Stay (HOSPITAL_COMMUNITY)
Admission: EM | Admit: 2018-11-16 | Discharge: 2018-11-19 | DRG: 378 | Disposition: A | Discharge status: Home / Self Care | Payer: Medicare Other | Attending: Internal Medicine | Admitting: Internal Medicine

## 2018-11-16 DIAGNOSIS — Z953 Presence of xenogenic heart valve: Secondary | ICD-10-CM | POA: Diagnosis not present

## 2018-11-16 DIAGNOSIS — K5900 Constipation, unspecified: Secondary | ICD-10-CM | POA: Diagnosis present

## 2018-11-16 DIAGNOSIS — D500 Iron deficiency anemia secondary to blood loss (chronic): Secondary | ICD-10-CM | POA: Diagnosis not present

## 2018-11-16 DIAGNOSIS — Z7902 Long term (current) use of antithrombotics/antiplatelets: Secondary | ICD-10-CM

## 2018-11-16 DIAGNOSIS — M419 Scoliosis, unspecified: Secondary | ICD-10-CM | POA: Diagnosis present

## 2018-11-16 DIAGNOSIS — K319 Disease of stomach and duodenum, unspecified: Secondary | ICD-10-CM | POA: Diagnosis present

## 2018-11-16 DIAGNOSIS — K648 Other hemorrhoids: Secondary | ICD-10-CM | POA: Diagnosis present

## 2018-11-16 DIAGNOSIS — E785 Hyperlipidemia, unspecified: Secondary | ICD-10-CM | POA: Diagnosis present

## 2018-11-16 DIAGNOSIS — M810 Age-related osteoporosis without current pathological fracture: Secondary | ICD-10-CM | POA: Diagnosis present

## 2018-11-16 DIAGNOSIS — Z7982 Long term (current) use of aspirin: Secondary | ICD-10-CM

## 2018-11-16 DIAGNOSIS — Z79890 Hormone replacement therapy (postmenopausal): Secondary | ICD-10-CM | POA: Diagnosis not present

## 2018-11-16 DIAGNOSIS — Z79899 Other long term (current) drug therapy: Secondary | ICD-10-CM

## 2018-11-16 DIAGNOSIS — Z951 Presence of aortocoronary bypass graft: Secondary | ICD-10-CM

## 2018-11-16 DIAGNOSIS — D62 Acute posthemorrhagic anemia: Secondary | ICD-10-CM | POA: Diagnosis present

## 2018-11-16 DIAGNOSIS — I252 Old myocardial infarction: Secondary | ICD-10-CM | POA: Diagnosis not present

## 2018-11-16 DIAGNOSIS — K3189 Other diseases of stomach and duodenum: Secondary | ICD-10-CM | POA: Diagnosis present

## 2018-11-16 DIAGNOSIS — E039 Hypothyroidism, unspecified: Secondary | ICD-10-CM | POA: Diagnosis present

## 2018-11-16 DIAGNOSIS — I2510 Atherosclerotic heart disease of native coronary artery without angina pectoris: Secondary | ICD-10-CM | POA: Diagnosis present

## 2018-11-16 DIAGNOSIS — R531 Weakness: Secondary | ICD-10-CM

## 2018-11-16 DIAGNOSIS — I10 Essential (primary) hypertension: Secondary | ICD-10-CM | POA: Diagnosis present

## 2018-11-16 DIAGNOSIS — E7800 Pure hypercholesterolemia, unspecified: Secondary | ICD-10-CM | POA: Diagnosis present

## 2018-11-16 DIAGNOSIS — K922 Gastrointestinal hemorrhage, unspecified: Secondary | ICD-10-CM | POA: Diagnosis not present

## 2018-11-16 DIAGNOSIS — D649 Anemia, unspecified: Secondary | ICD-10-CM

## 2018-11-16 DIAGNOSIS — K635 Polyp of colon: Secondary | ICD-10-CM | POA: Diagnosis present

## 2018-11-16 HISTORY — DX: Acute myocardial infarction, unspecified: I21.9

## 2018-11-16 HISTORY — DX: Other complications of anesthesia, initial encounter: T88.59XA

## 2018-11-16 HISTORY — DX: Adverse effect of unspecified anesthetic, initial encounter: T41.45XA

## 2018-11-16 LAB — BASIC METABOLIC PANEL
Anion gap: 7 (ref 5–15)
BUN: 26 mg/dL — ABNORMAL HIGH (ref 8–23)
CHLORIDE: 109 mmol/L (ref 98–111)
CO2: 22 mmol/L (ref 22–32)
Calcium: 9 mg/dL (ref 8.9–10.3)
Creatinine, Ser: 1.14 mg/dL — ABNORMAL HIGH (ref 0.44–1.00)
GFR calc Af Amer: 51 mL/min — ABNORMAL LOW (ref >60)
GFR calc non Af Amer: 44 mL/min — ABNORMAL LOW (ref >60)
Glucose, Bld: 99 mg/dL (ref 70–99)
Potassium: 4.5 mmol/L (ref 3.5–5.1)
Sodium: 138 mmol/L (ref 135–145)

## 2018-11-16 LAB — MRSA PCR SCREENING: MRSA by PCR: NEGATIVE

## 2018-11-16 LAB — CBC WITH DIFFERENTIAL/PLATELET
Abs Immature Granulocytes: 0.01 10*3/uL (ref 0.00–0.07)
Basophils Absolute: 0 10*3/uL (ref 0.0–0.1)
Basophils Relative: 0 %
Eosinophils Absolute: 0.2 10*3/uL (ref 0.0–0.5)
Eosinophils Relative: 4 %
HCT: 20.3 % — ABNORMAL LOW (ref 36.0–46.0)
HEMOGLOBIN: 6.1 g/dL — AB (ref 12.0–15.0)
IMMATURE GRANULOCYTES: 0 %
LYMPHS PCT: 25 %
Lymphs Abs: 1.3 10*3/uL (ref 0.7–4.0)
MCH: 26.8 pg (ref 26.0–34.0)
MCHC: 30 g/dL (ref 30.0–36.0)
MCV: 89 fL (ref 80.0–100.0)
Monocytes Absolute: 0.5 10*3/uL (ref 0.1–1.0)
Monocytes Relative: 10 %
Neutro Abs: 3.2 10*3/uL (ref 1.7–7.7)
Neutrophils Relative %: 61 %
Platelets: 217 10*3/uL (ref 150–400)
RBC: 2.28 MIL/uL — ABNORMAL LOW (ref 3.87–5.11)
RDW: 14.8 % (ref 11.5–15.5)
WBC: 5.2 10*3/uL (ref 4.0–10.5)
nRBC: 0 % (ref 0.0–0.2)

## 2018-11-16 LAB — ABO/RH: ABO/RH(D): O POS

## 2018-11-16 LAB — TROPONIN I: Troponin I: <0.03 ng/mL (ref <0.03)

## 2018-11-16 LAB — PREPARE RBC (CROSSMATCH)

## 2018-11-16 MED ORDER — CARVEDILOL 3.125 MG PO TABS
3.1250 mg | ORAL_TABLET | Freq: Two times a day (BID) | ORAL | Status: DC
  Administered 2018-11-16 – 2018-11-19 (×6): 3.125 mg via ORAL
  Filled 2018-11-16 (×6): qty 1

## 2018-11-16 MED ORDER — LEVOTHYROXINE SODIUM 50 MCG PO TABS
50.0000 ug | ORAL_TABLET | Freq: Every day | ORAL | Status: DC
  Administered 2018-11-16 – 2018-11-18 (×3): 50 ug via ORAL
  Filled 2018-11-16 (×3): qty 1

## 2018-11-16 MED ORDER — SODIUM CHLORIDE 0.9 % IV SOLN
INTRAVENOUS | Status: DC
  Administered 2018-11-16 – 2018-11-18 (×2): via INTRAVENOUS

## 2018-11-16 MED ORDER — SODIUM CHLORIDE 0.9% IV SOLUTION
Freq: Once | INTRAVENOUS | Status: AC
  Administered 2018-11-16: 23:00:00 via INTRAVENOUS

## 2018-11-16 MED ORDER — BOOST / RESOURCE BREEZE PO LIQD CUSTOM
1.0000 | Freq: Three times a day (TID) | ORAL | Status: DC
  Administered 2018-11-17 – 2018-11-18 (×4): 1 via ORAL

## 2018-11-16 MED ORDER — PANTOPRAZOLE SODIUM 40 MG IV SOLR
40.0000 mg | Freq: Two times a day (BID) | INTRAVENOUS | Status: DC
  Administered 2018-11-16 – 2018-11-19 (×6): 40 mg via INTRAVENOUS
  Filled 2018-11-16 (×6): qty 40

## 2018-11-16 MED ORDER — ONDANSETRON HCL 4 MG PO TABS: 4.0000 mg | ORAL_TABLET | Freq: Four times a day (QID) | ORAL | Status: DC | PRN

## 2018-11-16 MED ORDER — ONDANSETRON HCL 4 MG/2ML IJ SOLN: 4.0000 mg | Freq: Four times a day (QID) | INTRAMUSCULAR | Status: DC | PRN

## 2018-11-16 NOTE — ED Provider Notes (Signed)
[facility] EMERGENCY DEPARTMENT Provider Note   CSN: [id number] Arrival date & time: 11/16/18  1640    History   Chief Complaint Chief Complaint  Patient presents with  . Weakness    HPI [name] is a 85 y.o. female presenting for evaluation of weakness and anemia.  Patient states she has had gradually worsening weakness over the past week, to the point where she is having difficulty standing up and walking.  Patient states for the past month and a half, she has noticed either black or red stools.  She is on aspirin and clopidogrel.  She saw her PCP 3 days ago, and was called today telling her that her blood counts were low.  Her hemoglobin was 7.  She saw her GI doctor today, Dr. Loreta Ave, who did a rectal exam with guaiac positive stool.  Recommended patient come to [facility], ER and be admitted for EGD and colonoscopy.  Patient states she has been having difficulty having a bowel movement over the past several months.  Occasionally she takes laxatives to encourage a bowel movement.  Last normal bowel movement was last week.  Patient states she has had decreased appetite, reporting early satiety and feeling bloated.  She denies fevers, chills, chest pain, shortness of breath, nausea, vomiting, abdominal pain, urinary symptoms.  Paperwork from PCP and GI clinic visits reviewed.  Per PCP report, patient had a hemoglobin of 7 and a creatinine of 1.1.  Per GI notes, EGD and colonoscopy is scheduled for 04/01, after prep tomorrow.   Per PCP notes, patient with a history of internal hemorrhoids, MVP, hypertension, hyperlipidemia, hypothyroid, osteoporosis, scoliosis.  Patient is on aspirin, carvedilol, Nexium, amlodipine, losartan, Linzess, nitro sublingual (prn), clopidogrel, calcitriol, levothyroxine, pravastatin.     HPI  History reviewed. No pertinent past medical history.  Patient Active Problem List   Diagnosis Date Noted  . GI bleed 11/16/2018    Past  Surgical History:  Procedure Laterality Date  . CARDIAC SURGERY       OB History   No obstetric history on file.      Home Medications    Prior to Admission medications   Medication Sig Start Date End Date Taking? Authorizing Provider  amLODipine (NORVASC) 2.5 MG tablet Take 2.5 mg by mouth 2 (two) times daily. 11/12/18  Yes [provider]  aspirin EC 81 MG tablet Take 81 mg by mouth at bedtime. mid   Yes [provider]  carvedilol (COREG) 3.125 MG tablet Take 3.125 mg by mouth 2 (two) times daily. 07/31/18  Yes [provider]  clopidogrel (PLAVIX) 75 MG tablet Take 75 mg by mouth daily. 09/21/18  Yes [provider]  levothyroxine (SYNTHROID, LEVOTHROID) 50 MCG tablet Take 50 mcg by mouth at bedtime.   Yes [provider]  nitroGLYCERIN (NITROSTAT) 0.4 MG SL tablet Place under the tongue. 08/28/17  Yes [provider]  losartan (COZAAR) 100 MG tablet Take 100 mg by mouth at bedtime. 10/21/18   [provider]  Pitavastatin Calcium 2 MG TABS Take 2 mg by mouth at bedtime.    [provider]    Family History History reviewed. No pertinent family history.  Social History Social History   Tobacco Use  . Smoking status: Never Smoker  . Smokeless tobacco: Never Used  Substance Use Topics  . Alcohol use: Never    Frequency: Never  . Drug use: Never     Allergies   Patient has no  known allergies.   Review of Systems Review of Systems  Gastrointestinal: Positive for blood in stool and constipation.  Neurological: Positive for weakness.  Hematological: Bruises/bleeds easily.  All other systems reviewed and are negative.    Physical Exam Updated Vital Signs BP (!) 133/46   Pulse 70   Temp 97.8 F (36.6 C) (Oral)   Resp 14   Ht 5\' 3"  (1.6 m)   Wt 45.4 kg   SpO2 100%   BMI 17.71 kg/m   Physical Exam Vitals signs and nursing note reviewed.  Constitutional:      General: She is not in acute  distress.    Appearance: She is well-developed.     Comments: Elderly, cachectic appearing female  HENT:     Head: Normocephalic and atraumatic.  Eyes:     Extraocular Movements: Extraocular movements intact.     Conjunctiva/sclera: Conjunctivae normal.     Pupils: Pupils are equal, round, and reactive to light.  Neck:     Musculoskeletal: Normal range of motion and neck supple.  Cardiovascular:     Rate and Rhythm: Normal rate and regular rhythm.  Pulmonary:     Effort: Pulmonary effort is normal. No respiratory distress.     Breath sounds: Normal breath sounds. No wheezing.     Comments: Speaking in full sentences. Abdominal:     General: There is no distension.     Palpations: Abdomen is soft. There is no mass.     Tenderness: There is no abdominal tenderness. There is no guarding or rebound.     Comments: No tenderness palpation the abdomen.  Soft without rigidity, guarding, tension.  No masses.  Musculoskeletal: Normal range of motion.  Skin:    General: Skin is warm and dry.     Capillary Refill: Capillary refill takes less than 2 seconds.  Neurological:     Mental Status: She is alert and oriented to person, place, and time.      ED Treatments / Results  Labs (all labs ordered are listed, but only abnormal results are displayed) Labs Reviewed  CBC WITH DIFFERENTIAL/PLATELET - Abnormal; Notable for the following components:      Result Value   RBC 2.28 (*)    Hemoglobin 6.1 (*)    HCT 20.3 (*)    All other components within normal limits  BASIC METABOLIC PANEL - Abnormal; Notable for the following components:   BUN 26 (*)    Creatinine, Ser 1.14 (*)    GFR calc non Af Amer 44 (*)    GFR calc Af Amer 51 (*)    All other components within normal limits  TROPONIN I  TYPE AND SCREEN  PREPARE RBC (CROSSMATCH)  ABO/RH    EKG EKG Interpretation  Date/Time:  Monday November 16 2018 16:52:43 EDT Ventricular Rate:  63 PR Interval:    QRS Duration: 102 QT  Interval:  426 QTC Calculation: 437 R Axis:   30 Text Interpretation:  Sinus or ectopic atrial rhythm Atrial premature complex RSR' in V1 or V2, right VCD or RVH Nonspecific T abnormalities, lateral leads Since prior ECG, TW inversions aVL more pronounced than prior Confirmed by Alvira Monday ([id number]) on 11/16/2018 5:20:37 PM   Radiology No results found.  Procedures .Critical Care Performed by: Alveria Apley, PA-C Authorized by: Alveria Apley, PA-C   Critical care provider statement:    Critical care time (minutes):  35   Critical care time was exclusive of:  Separately billable procedures and treating other patients  and teaching time   Critical care was necessary to treat or prevent imminent or life-threatening deterioration of the following conditions:  Circulatory failure   Critical care was time spent personally by me on the following activities:  Development of treatment plan with patient or surrogate, blood draw for specimens, evaluation of patient's response to treatment, examination of patient, obtaining history from patient or surrogate, ordering and performing treatments and interventions, ordering and review of laboratory studies, ordering and review of radiographic studies, pulse oximetry, re-evaluation of patient's condition and review of old charts   I assumed direction of critical care for this patient from another provider in my specialty: no   Comments:     Pt with critically low hgb requiring blood transfusion and admission.    (including critical care time)   Medications Ordered in ED Medications  0.9 %  sodium chloride infusion (Manually program via Guardrails IV Fluids) (has no administration in time range)     Initial Impression / Assessment and Plan / ED Course  I have reviewed the triage vital signs and the nursing notes.  Pertinent labs & imaging results that were available during my care of the patient were reviewed by me and considered in my  medical decision making (see chart for details).        Patient presenting for evaluation of weakness and anemia secondary to GI bleed.  Hgb was 7.0 3 days ago, I expect it is lower today, as patient has had continued bleeding and is not on a blood thinner.  Patient had a positive guaiac rectal exam today with GI, we will not repeat in the ED.  Will order baseline blood work, type and screen, and plan for admission.  Hemoglobin critically low at 6.1.  Will start 2 units of blood for transfusion.  Troponin negative.  EKG without STEMI.  BMP shows creatinine stable at 1.1 when compared to lab work from PCP.  Otherwise reassuring.  Case discussed with attending, Dr. Dalene Seltzer evaluated the patient.  Will call for admission.  Discussed with Dr[facility] Sharyon Medicus [facility], patient to be admitted.   Final Clinical Impressions(s) / ED Diagnoses   Final diagnoses:  Gastrointestinal hemorrhage, unspecified gastrointestinal hemorrhage type  Symptomatic anemia  Generalized weakness    ED Discharge Orders    None       Alveria Apley, PA-C 11/16/18 1849    Alvira Monday, MD 11/17/18 [phone]

## 2018-11-16 NOTE — ED Notes (Signed)
ED TO INPATIENT HANDOFF REPORT  ED Nurse Name and Phone #: Delorise Jackson [phone]  S Name/Age/Gender [name] 85 y.o. female Room/Bed: 033C/033C  Code Status   Code Status: Not on file  Home/SNF/Other Nursing Home  Triage Complete: Triage complete  Chief Complaint GI Bleed  Triage Note Pt arrives from home complaining of weakness and blood in stool x1 month.    Allergies No Known Allergies  Level of Care/Admitting Diagnosis ED Disposition    ED Disposition Condition Comment   Admit  Hospital Area: [facility] [[id number]]  Level of Care: Progressive [102]  Diagnosis: GI bleed [122482]  Admitting Physician: Carron Curie [[id number]]  Attending Physician: Sharyon Medicus, ALI Bai.Lain  Estimated length of stay: past midnight tomorrow  Certification:: I certify this patient will need inpatient services for at least 2 midnights  PT Class (Do Not Modify): Inpatient [101]  PT Acc Code (Do Not Modify): Private [1]       B Medical/Surgery History History reviewed. No pertinent past medical history. Past Surgical History:  Procedure Laterality Date  . CARDIAC SURGERY       A IV Location/Drains/Wounds Patient Lines/Drains/Airways Status   Active Line/Drains/Airways    Name:   Placement date:   Placement time:   Site:   Days:   Peripheral IV 11/16/18 Left Antecubital   11/16/18    1656    Antecubital   less than 1          Intake/Output Last 24 hours No intake or output data in the 24 hours ending 11/16/18 2000  Labs/Imaging Results for orders placed or performed during the hospital encounter of 11/16/18 (from the past 48 hour(s))  CBC with Differential     Status: Abnormal   Collection Time: 11/16/18  5:11 PM  Result Value Ref Range   WBC 5.2 4.0 - 10.5 K/uL   RBC 2.28 (L) 3.87 - 5.11 MIL/uL   Hemoglobin 6.1 (LL) 12.0 - 15.0 g/dL    Comment: REPEATED TO VERIFY THIS CRITICAL RESULT HAS VERIFIED AND BEEN CALLED TO C BAIN,RN BY DENNIS BRADLEY ON 03 30 2020 AT 1748,  AND HAS BEEN READ BACK. READ BACK AND VERIFIED    HCT 20.3 (L) 36.0 - 46.0 %   MCV 89.0 80.0 - 100.0 fL   MCH 26.8 26.0 - 34.0 pg   MCHC 30.0 30.0 - 36.0 g/dL   RDW 70.4 88.8 - 91.6 %   Platelets 217 150 - 400 K/uL   nRBC 0.0 0.0 - 0.2 %   Neutrophils Relative % 61 %   Neutro Abs 3.2 1.7 - 7.7 K/uL   Lymphocytes Relative 25 %   Lymphs Abs 1.3 0.7 - 4.0 K/uL   Monocytes Relative 10 %   Monocytes Absolute 0.5 0.1 - 1.0 K/uL   Eosinophils Relative 4 %   Eosinophils Absolute 0.2 0.0 - 0.5 K/uL   Basophils Relative 0 %   Basophils Absolute 0.0 0.0 - 0.1 K/uL   Immature Granulocytes 0 %   Abs Immature Granulocytes 0.01 0.00 - 0.07 K/uL    Comment: Performed at [facility], [address] [place], [place] [address]  Basic metabolic panel     Status: Abnormal   Collection Time: 11/16/18  5:11 PM  Result Value Ref Range   Sodium 138 135 - 145 mmol/L   Potassium 4.5 3.5 - 5.1 mmol/L   Chloride 109 98 - 111 mmol/L   CO2 22 22 - 32 mmol/L   Glucose, Bld  99 70 - 99 mg/dL   BUN 26 (H) 8 - 23 mg/dL   Creatinine, Ser 0.93 (H) 0.44 - 1.00 mg/dL   Calcium 9.0 8.9 - 81.8 mg/dL   GFR calc non Af Amer 44 (L) >60 mL/min   GFR calc Af Amer 51 (L) >60 mL/min   Anion gap 7 5 - 15    Comment: Performed at [facility], [address] [place], [place] [address]  Troponin I - ONCE - STAT     Status: None   Collection Time: 11/16/18  5:11 PM  Result Value Ref Range   Troponin I <0.03 <0.03 ng/mL    Comment: Performed at [facility], [address], [place], [place] [address]  Type and screen [facility]     Status: None (Preliminary result)   Collection Time: 11/16/18  5:35 PM  Result Value Ref Range   ABO/RH(D) O POS    Antibody Screen NEG    Sample Expiration 11/19/2018    Unit Number [id number]    Blood Component Type RED CELLS,LR    Unit division 00    Status of Unit ALLOCATED    Transfusion Status OK TO TRANSFUSE    Crossmatch Result       Compatible Performed at [facility], [address], [place], [place] [address]   ABO/Rh     Status: None (Preliminary result)   Collection Time: 11/16/18  5:35 PM  Result Value Ref Range   ABO/RH(D)      O POS Performed at [facility], [address] [place], [place] [address]   Prepare RBC     Status: None   Collection Time: 11/16/18  6:09 PM  Result Value Ref Range   Order Confirmation      ORDER PROCESSED BY BLOOD BANK Performed at [facility], [address], [place], [place] [address]    No results found.  Pending Labs [employer] (From admission, onward)    Start     Ordered   Signed and Held  CBC  Tomorrow morning,   R     Signed and Held          Vitals/Pain Today's Vitals   11/16/18 1830 11/16/18 1845 11/16/18 1900 11/16/18 1945  BP: (!) 149/62 126/69 (!) 120/54 (!) 119/57  Pulse: 69 72 67 70  Resp: (!) 21 19 19 19   Temp:      TempSrc:      SpO2: 100% 100% 100% 98%  Weight:      Height:      PainSc:        Isolation Precautions No active isolations  Medications Medications  0.9 %  sodium chloride infusion (Manually program via Guardrails IV Fluids) (has no administration in time range)    Mobility walks with person assist Low fall risk   Focused Assessments Cardiac Assessment Handoff:  Cardiac Rhythm: Normal sinus rhythm Lab Results  Component Value Date   TROPONINI <0.03 11/16/2018   No results found for: DDIMER Does the Patient currently have chest pain? No     R Recommendations: See Admitting Provider Note  Report given to:   Additional Notes:

## 2018-11-16 NOTE — ED Triage Notes (Signed)
Pt arrives from home complaining of weakness and blood in stool x1 month.

## 2018-11-16 NOTE — H&P (Addendum)
[facility]                                                                                    Patient Demographics  [name], is a 85 y.o. female  CSN: [id number]  MRN: [id number]  DOB - 1932-03-09  Admit Date - 11/16/2018  Outpatient Primary MD for the patient is Patient, No Pcp Per   With History of -  History reviewed. No pertinent past medical history.    Past Surgical History:  Procedure Laterality Date  . CARDIAC SURGERY      in for   Chief Complaint  Patient presents with  . Weakness     HPI  [name]  is a 85 y.o. female, with past medical history significant for CAD status post CABG and mitral valve replacement in the past, hypertension and hypothyroidism on Plavix presenting with few weeks history of progressive weakness.  Patient denies any chest pains but reports dizziness.  Denies any nausea vomiting or  diarrhea.  Denies abdominal pain or fever. Patient was sent from Dr. Kenna Gilbert office today after she was noted to have maroonish stool. Work-up in the emergency room showed a hemoglobin of 6.1.    Review of Systems    In addition to the HPI above,  No Fever-chills, No Headache, No changes with Vision or hearing, No problems swallowing food or Liquids, No Chest pain, Cough or Shortness of Breath, No Abdominal pain, No Nausea or Vommitting,  No Blood in stool or Urine, No dysuria, No new skin rashes or bruises, No new joints pains-aches,  No tingling, numbness in any extremity, No recent weight gain or loss, No polyuria, polydypsia or polyphagia, No significant Mental Stressors.  A full 10 point Review of Systems was done, except as stated above, all other Review of Systems were negative.   Social History Social History   Tobacco Use  . Smoking status: Never Smoker  . Smokeless tobacco: Never Used  Substance Use Topics  . Alcohol use: Never    Frequency: Never     Family History History reviewed. No pertinent  family history.   Prior to Admission medications   Medication Sig Start Date End Date Taking? Authorizing Provider  amLODipine (NORVASC) 2.5 MG tablet Take 2.5 mg by mouth 2 (two) times daily. 11/12/18  Yes [provider]  aspirin EC 81 MG tablet Take 81 mg by mouth at bedtime. mid   Yes [provider]  carvedilol (COREG) 3.125 MG tablet Take 3.125 mg by mouth 2 (two) times daily. 07/31/18  Yes [provider]  clopidogrel (PLAVIX) 75 MG tablet Take 75 mg by mouth daily. 09/21/18  Yes [provider]  levothyroxine (SYNTHROID, LEVOTHROID) 50 MCG tablet Take 50 mcg by mouth at bedtime.   Yes [provider]  nitroGLYCERIN (NITROSTAT) 0.4 MG SL tablet Place under the tongue. 08/28/17  Yes [provider]  losartan (COZAAR) 100 MG tablet Take 100 mg by mouth at bedtime. 10/21/18   [provider]  Pitavastatin Calcium 2 MG TABS Take 2 mg by mouth at bedtime.    [provider]    No Known Allergies  Physical  Exam  Vitals  Blood pressure (!) 133/46, pulse 70, temperature 97.8 F (36.6 C), temperature source Oral, resp. rate 14, height 5\' 3"  (1.6 m), weight 45.4 kg, SpO2 100 %.   1. General elderly female extremely pleasant in no acute distress  2. Normal affect and insight, Not Suicidal or Homicidal, Awake Alert, Oriented X 3.  3. No F.N deficits, grossly, patient moving all extremities.  4. Ears and Eyes appear Normal, Conjunctivae clear, PERRLA. Moist Oral Mucosa.  5. Supple Neck, No JVD, No cervical lymphadenopathy appriciated, No Carotid Bruits.  6. Symmetrical Chest wall movement, decreased breath sounds at the bases.  7.  Irregular, 3/6 systolic murmur, No Parasternal Heave.  8. Positive Bowel Sounds, Abdomen Soft, Non tender,.  9.  No Cyanosis, Normal Skin Turgor, No Skin Rash or Bruise.  10. Good muscle tone,  joints appear normal , no effusions, Normal ROM.    Data Review  CBC Recent Labs  Lab  11/16/18 1711  WBC 5.2  HGB 6.1*  HCT 20.3*  PLT 217  MCV 89.0  MCH 26.8  MCHC 30.0  RDW 14.8  LYMPHSABS 1.3  MONOABS 0.5  EOSABS 0.2  BASOSABS 0.0   ------------------------------------------------------------------------------------------------------------------  Chemistries  Recent Labs  Lab 11/16/18 1711  NA 138  K 4.5  CL 109  CO2 22  GLUCOSE 99  BUN 26*  CREATININE 1.14*  CALCIUM 9.0   ------------------------------------------------------------------------------------------------------------------ estimated creatinine clearance is 25.9 mL/min (A) (by C-G formula based on SCr of 1.14 mg/dL (H)). ------------------------------------------------------------------------------------------------------------------ No results for input(s): TSH, T4TOTAL, T3FREE, THYROIDAB in the last 72 hours.  Invalid input(s): FREET3   Coagulation profile No results for input(s): INR, PROTIME in the last 168 hours. ------------------------------------------------------------------------------------------------------------------- No results for input(s): DDIMER in the last 72 hours. -------------------------------------------------------------------------------------------------------------------  Cardiac Enzymes Recent Labs  Lab 11/16/18 1711  TROPONINI <0.03   ------------------------------------------------------------------------------------------------------------------ Invalid input(s): POCBNP   ---------------------------------------------------------------------------------------------------------------  Urinalysis No results found for: COLORURINE, APPEARANCEUR, LABSPEC, PHURINE, GLUCOSEU, HGBUR, BILIRUBINUR, KETONESUR, PROTEINUR, UROBILINOGEN, NITRITE, LEUKOCYTESUR  ----------------------------------------------------------------------------------------------------------------   Imaging results:   No results found.  My personal review of EKG: Normal sinus  rhythm at 63 bpm with PACs and incomplete right bundle branch block  Assessment & Plan  GI bleed; patient has a history of GI bleed with colonoscopy done in 2004. Hold Plavix Blood transfusion Check H/H in a.m. Protonix IV Dr. Loreta Ave on consult Patient scheduled for endoscopy on Wednesday morning  Anemia status post TXM in the emergency room Blood transfusion   CAD status post CABG Asymptomatic at this time Continue with Coreg/Pravachol  Hypercholesterolemia Continue with Pravachol  Hypertension Hold Norvasc continue with Coreg and monitor closely   DVT Prophylaxis SCDs  AM Labs Ordered, also please review Full Orders    Code Status full  Disposition Plan: Home  Time spent in minutes : 41 minutes  Condition GUARDED   @SIGNATURE @

## 2018-11-17 ENCOUNTER — Inpatient Hospital Stay (HOSPITAL_COMMUNITY): Payer: Medicare Other

## 2018-11-17 DIAGNOSIS — Z953 Presence of xenogenic heart valve: Secondary | ICD-10-CM

## 2018-11-17 DIAGNOSIS — I10 Essential (primary) hypertension: Secondary | ICD-10-CM | POA: Diagnosis present

## 2018-11-17 DIAGNOSIS — D500 Iron deficiency anemia secondary to blood loss (chronic): Secondary | ICD-10-CM | POA: Diagnosis present

## 2018-11-17 LAB — CBC
HCT: 29.7 % — ABNORMAL LOW (ref 36.0–46.0)
HCT: 32.9 % — ABNORMAL LOW (ref 36.0–46.0)
HEMATOCRIT: 23.5 % — AB (ref 36.0–46.0)
Hemoglobin: 7.3 g/dL — ABNORMAL LOW (ref 12.0–15.0)
Hemoglobin: 9.5 g/dL — ABNORMAL LOW (ref 12.0–15.0)
Hemoglobin: 11 g/dL — ABNORMAL LOW (ref 12.0–15.0)
MCH: 27.5 pg (ref 26.0–34.0)
MCH: 28 pg (ref 26.0–34.0)
MCH: 28.6 pg (ref 26.0–34.0)
MCHC: 31.1 g/dL (ref 30.0–36.0)
MCHC: 32 g/dL (ref 30.0–36.0)
MCHC: 33.4 g/dL (ref 30.0–36.0)
MCV: 88.7 fL (ref 80.0–100.0)
MCV: 87.6 fL (ref 80.0–100.0)
MCV: 85.5 fL (ref 80.0–100.0)
Platelets: 190 10*3/uL (ref 150–400)
Platelets: 212 10*3/uL (ref 150–400)
Platelets: 232 10*3/uL (ref 150–400)
RBC: 2.65 MIL/uL — ABNORMAL LOW (ref 3.87–5.11)
RBC: 3.39 MIL/uL — ABNORMAL LOW (ref 3.87–5.11)
RBC: 3.85 MIL/uL — ABNORMAL LOW (ref 3.87–5.11)
RDW: 14.6 % (ref 11.5–15.5)
RDW: 14.2 % (ref 11.5–15.5)
RDW: 14.5 % (ref 11.5–15.5)
WBC: 5.1 10*3/uL (ref 4.0–10.5)
WBC: 5.9 10*3/uL (ref 4.0–10.5)
WBC: 6.6 10*3/uL (ref 4.0–10.5)
nRBC: 0 % (ref 0.0–0.2)
nRBC: 0 % (ref 0.0–0.2)
nRBC: 0 % (ref 0.0–0.2)

## 2018-11-17 LAB — COMPREHENSIVE METABOLIC PANEL
ALT: 11 U/L (ref 0–44)
AST: 20 U/L (ref 15–41)
Albumin: 3.2 g/dL — ABNORMAL LOW (ref 3.5–5.0)
Alkaline Phosphatase: 42 U/L (ref 38–126)
Anion gap: 10 (ref 5–15)
BUN: 19 mg/dL (ref 8–23)
CO2: 21 mmol/L — ABNORMAL LOW (ref 22–32)
Calcium: 9.3 mg/dL (ref 8.9–10.3)
Chloride: 109 mmol/L (ref 98–111)
Creatinine, Ser: 1.17 mg/dL — ABNORMAL HIGH (ref 0.44–1.00)
GFR calc Af Amer: 49 mL/min — ABNORMAL LOW (ref >60)
GFR calc non Af Amer: 42 mL/min — ABNORMAL LOW (ref >60)
Glucose, Bld: 80 mg/dL (ref 70–99)
Potassium: 4.1 mmol/L (ref 3.5–5.1)
Sodium: 140 mmol/L (ref 135–145)
Total Bilirubin: 1.1 mg/dL (ref 0.3–1.2)
Total Protein: 5.6 g/dL — ABNORMAL LOW (ref 6.5–8.1)

## 2018-11-17 LAB — PREPARE RBC (CROSSMATCH)

## 2018-11-17 LAB — MAGNESIUM: Magnesium: 2.1 mg/dL (ref 1.7–2.4)

## 2018-11-17 LAB — PROTIME-INR
INR: 1.2 (ref 0.8–1.2)
Prothrombin Time: 15.3 seconds — ABNORMAL HIGH (ref 11.4–15.2)

## 2018-11-17 MED ORDER — FUROSEMIDE 10 MG/ML IJ SOLN
40.0000 mg | Freq: Once | INTRAMUSCULAR | Status: AC
  Administered 2018-11-17: 40 mg via INTRAVENOUS
  Filled 2018-11-17: qty 4

## 2018-11-17 MED ORDER — PEG 3350-KCL-NA BICARB-NACL 420 G PO SOLR
4000.0000 mL | Freq: Once | ORAL | Status: AC
  Administered 2018-11-17: 4000 mL via ORAL
  Filled 2018-11-17: qty 4000

## 2018-11-17 MED ORDER — HYDRALAZINE HCL 20 MG/ML IJ SOLN: 10.0000 mg | Freq: Four times a day (QID) | INTRAMUSCULAR | Status: DC | PRN

## 2018-11-17 MED ORDER — IOHEXOL 300 MG/ML  SOLN
80.0000 mL | Freq: Once | INTRAMUSCULAR | Status: AC | PRN
  Administered 2018-11-17: 80 mL via INTRAVENOUS

## 2018-11-17 MED ORDER — POLYETHYLENE GLYCOL 3350 17 GM/SCOOP PO POWD
1.0000 | Freq: Once | ORAL | Status: AC
  Administered 2018-11-17: 255 g via ORAL
  Filled 2018-11-17: qty 255

## 2018-11-17 MED ORDER — SODIUM CHLORIDE 0.9% IV SOLUTION
Freq: Once | INTRAVENOUS | Status: AC
  Administered 2018-11-17: 10:00:00 via INTRAVENOUS

## 2018-11-17 NOTE — Progress Notes (Signed)
PROGRESS NOTE                                                                                                                                                                                                             Patient Demographics:    [name], is a 85 y.o. female, DOB - [birth year], KGU:[id number]  Admit date - 11/16/2018   Admitting Physician Carron Curie, MD  Outpatient Primary MD for the patient is Patient, No Pcp Per  LOS - 1  Chief Complaint  Patient presents with  . Weakness       Brief Narrative  [name]  is a 85 y.o. female, with past medical history significant for CAD status post CABG and mitral valve replacement in the past, hypertension and hypothyroidism on Plavix presenting with few weeks history of progressive weakness.  Patient denies any chest pains but reports dizziness.  Denies any nausea vomiting or  diarrhea.  Denies abdominal pain or fever. Patient was sent from Dr. Kenna Gilbert office today after she was noted to have maroonish stool. Work-up in the emergency room showed a hemoglobin of 6.1.   Subjective:    [name] today has, No headache, No chest pain, No abdominal pain - No Nausea, No new weakness tingling or numbness, No Cough - SOB.    Assessment  & Plan :      1.  Lower GI bleed with acute blood loss related anemia.  Required 2 units of blood transfusion on admission will get another unit of packed RBC on 11/17/2018, bleeding currently seems to have abated, clear liquid diet, obtain CT scan abdomen pelvis likely diverticular bleed.  Continue PPI, continue to hold dual antiplatelets.  GI Dr. Loreta Ave to see.  2.  Essential hypertension is currently on Coreg continue will add PRN hydralazine.  3.  History of bioprosthetic aortic valve replacement over 10 years ago at [facility].  Stable supportive care.  4.  Dyslipidemia.  Continue statin.  5.  CAD status post CABG.  On Coreg and statin dual antiplatelets held due to #1 above.     Family Communication  : None  Code Status :  Full  Disposition Plan  :  TBD  Consults  :  GI - Mann  Procedures  :    CT -   DVT Prophylaxis  :    SCDs    Lab Results  Component Value Date   PLT 190 11/17/2018    Diet :  Diet Order  Diet clear liquid Room service appropriate? Yes; Fluid consistency: Thin  Diet effective now               Inpatient Medications Scheduled Meds: . sodium chloride   Intravenous Once  . carvedilol  3.125 mg Oral BID  . feeding supplement  1 Container Oral TID BM  . furosemide  40 mg Intravenous Once  . levothyroxine  50 mcg Oral QHS  . pantoprazole (PROTONIX) IV  40 mg Intravenous Q12H   Continuous Infusions: . sodium chloride 50 mL/hr at 11/17/18 0319   PRN Meds:.ondansetron **OR** ondansetron (ZOFRAN) IV  Antibiotics  :   Anti-infectives (From admission, onward)   None          Objective:   Vitals:   11/16/18 2200 11/16/18 2229 11/17/18 0121 11/17/18 0758  BP: 130/62 138/85 109/62   Pulse: 70 68 64   Resp: Temp: 98.3 F (36.8 C) 98.5 F (36.9 C) 98.5 F (36.9 C) 98.2 F (36.8 C)  TempSrc: Oral Oral Oral Oral  SpO2: 100% 100% 99%   Weight:      Height:        Wt Readings from Last 3 Encounters:  11/16/18 45.4 kg     Intake/Output Summary (Last 24 hours) at 11/17/2018 0829 Last data filed at 11/17/2018 1610 Gross per 24 hour  Intake 476.54 ml  Output 350 ml  Net 126.54 ml     Physical Exam  Awake Alert, Oriented X 3, No new F.N deficits, Normal affect [place].AT,PERRAL Supple Neck,No JVD, No cervical lymphadenopathy appriciated.  Symmetrical Chest wall movement, Good air movement bilaterally, CTAB RRR,No Gallops,Rubs or new Murmurs, No Parasternal Heave +ve B.Sounds, Abd Soft, No tenderness, No organomegaly appriciated, No rebound - guarding or rigidity. No Cyanosis, Clubbing or edema, No new Rash or bruise       Data Review:    CBC Recent Labs  Lab 11/16/18 1711 11/17/18  0600  WBC 5.2 5.1  HGB 6.1* 7.3*  HCT 20.3* 23.5*  PLT 217 190  MCV 89.0 88.7  MCH 26.8 27.5  MCHC 30.0 31.1  RDW 14.8 14.6  LYMPHSABS 1.3  --   MONOABS 0.5  --   EOSABS 0.2  --   BASOSABS 0.0  --     Chemistries  Recent Labs  Lab 11/16/18 1711 11/17/18 0659  NA 138 140  K 4.5 4.1  CL 109 109  CO2 22 21*  GLUCOSE 99 80  BUN 26* 19  CREATININE 1.14* 1.17*  CALCIUM 9.0 9.3  MG  --  2.1  AST  --  20  ALT  --  11  ALKPHOS  --  42  BILITOT  --  1.1   ------------------------------------------------------------------------------------------------------------------ No results for input(s): CHOL, HDL, LDLCALC, TRIG, CHOLHDL, LDLDIRECT in the last 72 hours.  No results found for: HGBA1C ------------------------------------------------------------------------------------------------------------------ No results for input(s): TSH, T4TOTAL, T3FREE, THYROIDAB in the last 72 hours.  Invalid input(s): FREET3 ------------------------------------------------------------------------------------------------------------------ No results for input(s): VITAMINB12, FOLATE, FERRITIN, TIBC, IRON, RETICCTPCT in the last 72 hours.  Coagulation profile Recent Labs  Lab 11/17/18 0659  INR 1.2    No results for input(s): DDIMER in the last 72 hours.  Cardiac Enzymes Recent Labs  Lab 11/16/18 1711  TROPONINI <0.03   ------------------------------------------------------------------------------------------------------------------ No results found for: BNP  Micro Results Recent Results (from the past 240 hour(s))  MRSA PCR Screening     Status: None   Collection Time: 11/16/18  9:28 PM  Result  Value Ref Range Status   MRSA by PCR NEGATIVE NEGATIVE Final    Comment:        The GeneXpert MRSA Assay (FDA approved for NASAL specimens only), is one component of a comprehensive MRSA colonization surveillance program. It is not intended to diagnose MRSA infection nor to guide or  monitor treatment for MRSA infections. Performed at [facility], [address], [place], [place] [address]     Radiology Reports No results found.  Time Spent in minutes  30   Susa Raring M.D on 11/17/2018 at 8:29 AM  To page go to [email or web address] - password [facility]

## 2018-11-17 NOTE — Consult Note (Signed)
Reason for Consult: IDA Referring Physician: [facility]  [name] HPI: This is an 85 year old female without significant PMH admitted for complaints of symptomatic anemia.  She states that over the past several weeks she started to have DOE with light exertion.  It was to the point that movement beyond sitting caused her to feel fatigued and SOB.  The patient noticed over the past month of March to have some dark stools and the possibility of blood in the stool diffusing out into the toilet water, after a bowel movement.  She sought care from Dr. Jacqulyn Bath office and she was noted to be anemic.  Her HGB was 6.1 g/dL on admission and she received one unit of PRBC.  Her last colonoscopy with Dr. Loreta Ave was in 2004 and she denies any use of NSAIDs.  Also, she denies any problems with chest pain with exertion.  History reviewed. No pertinent past medical history.  Past Surgical History:  Procedure Laterality Date  . CARDIAC SURGERY      History reviewed. No pertinent family history.  Social History:  reports that she has never smoked. She has never used smokeless tobacco. She reports that she does not drink alcohol or use drugs.  Allergies: No Known Allergies  Medications:  Scheduled: . carvedilol  3.125 mg Oral BID  . feeding supplement  1 Container Oral TID BM  . levothyroxine  50 mcg Oral QHS  . pantoprazole (PROTONIX) IV  40 mg Intravenous Q12H  . polyethylene glycol powder  1 Container Oral Once  . polyethylene glycol-electrolytes  4,000 mL Oral Once   Continuous: . sodium chloride 50 mL/hr at 11/17/18 0319    Results for orders placed or performed during the hospital encounter of 11/16/18 (from the past 24 hour(s))  CBC with Differential     Status: Abnormal   Collection Time: 11/16/18  5:11 PM  Result Value Ref Range   WBC 5.2 4.0 - 10.5 K/uL   RBC 2.28 (L) 3.87 - 5.11 MIL/uL   Hemoglobin 6.1 (LL) 12.0 - 15.0 g/dL   HCT 37.1 (L) 69.6 - 78.9 %   MCV 89.0 80.0 -  100.0 fL   MCH 26.8 26.0 - 34.0 pg   MCHC 30.0 30.0 - 36.0 g/dL   RDW 38.1 01.7 - 51.0 %   Platelets 217 150 - 400 K/uL   nRBC 0.0 0.0 - 0.2 %   Neutrophils Relative % 61 %   Neutro Abs 3.2 1.7 - 7.7 K/uL   Lymphocytes Relative 25 %   Lymphs Abs 1.3 0.7 - 4.0 K/uL   Monocytes Relative 10 %   Monocytes Absolute 0.5 0.1 - 1.0 K/uL   Eosinophils Relative 4 %   Eosinophils Absolute 0.2 0.0 - 0.5 K/uL   Basophils Relative 0 %   Basophils Absolute 0.0 0.0 - 0.1 K/uL   Immature Granulocytes 0 %   Abs Immature Granulocytes 0.01 0.00 - 0.07 K/uL  Basic metabolic panel     Status: Abnormal   Collection Time: 11/16/18  5:11 PM  Result Value Ref Range   Sodium 138 135 - 145 mmol/L   Potassium 4.5 3.5 - 5.1 mmol/L   Chloride 109 98 - 111 mmol/L   CO2 22 22 - 32 mmol/L   Glucose, Bld 99 70 - 99 mg/dL   BUN 26 (H) 8 - 23 mg/dL   Creatinine, Ser 2.58 (H) 0.44 - 1.00 mg/dL   Calcium 9.0 8.9 - 52.7 mg/dL   GFR calc  non Af Amer 44 (L) >60 mL/min   GFR calc Af Amer 51 (L) >60 mL/min   Anion gap 7 5 - 15  Troponin I - ONCE - STAT     Status: None   Collection Time: 11/16/18  5:11 PM  Result Value Ref Range   Troponin I <0.03 <0.03 ng/mL  Type and screen [facility]     Status: None (Preliminary result)   Collection Time: 11/16/18  5:35 PM  Result Value Ref Range   ABO/RH(D) O POS    Antibody Screen NEG    Sample Expiration 11/19/2018    Unit Number [id number]    Blood Component Type RED CELLS,LR    Unit division 00    Status of Unit ISSUED    Transfusion Status OK TO TRANSFUSE    Crossmatch Result Compatible    Unit Number [id number]    Blood Component Type RED CELLS,LR    Unit division 00    Status of Unit ISSUED    Transfusion Status OK TO TRANSFUSE    Crossmatch Result      Compatible Performed at [facility], [address] [place], [place] [address]   ABO/Rh     Status: None   Collection Time: 11/16/18  5:35 PM  Result Value Ref Range    ABO/RH(D)      O POS Performed at [facility], [address] [place], [place] [address]   Prepare RBC     Status: None   Collection Time: 11/16/18  6:09 PM  Result Value Ref Range   Order Confirmation      ORDER PROCESSED BY BLOOD BANK Performed at [facility], [address] [place], [place] [address]   MRSA PCR Screening     Status: None   Collection Time: 11/16/18  9:28 PM  Result Value Ref Range   MRSA by PCR NEGATIVE NEGATIVE  CBC     Status: Abnormal   Collection Time: 11/17/18  6:00 AM  Result Value Ref Range   WBC 5.1 4.0 - 10.5 K/uL   RBC 2.65 (L) 3.87 - 5.11 MIL/uL   Hemoglobin 7.3 (L) 12.0 - 15.0 g/dL   HCT 84.123.5 (L) 32.436.0 - 40.146.0 %   MCV 88.7 80.0 - 100.0 fL   MCH 27.5 26.0 - 34.0 pg   MCHC 31.1 30.0 - 36.0 g/dL   RDW 02.714.6 25.311.5 - 66.415.5 %   Platelets 190 150 - 400 K/uL   nRBC 0.0 0.0 - 0.2 %  Comprehensive metabolic panel     Status: Abnormal   Collection Time: 11/17/18  6:59 AM  Result Value Ref Range   Sodium 140 135 - 145 mmol/L   Potassium 4.1 3.5 - 5.1 mmol/L   Chloride 109 98 - 111 mmol/L   CO2 21 (L) 22 - 32 mmol/L   Glucose, Bld 80 70 - 99 mg/dL   BUN 19 8 - 23 mg/dL   Creatinine, Ser 4.031.17 (H) 0.44 - 1.00 mg/dL   Calcium 9.3 8.9 - 47.410.3 mg/dL   Total Protein 5.6 (L) 6.5 - 8.1 g/dL   Albumin 3.2 (L) 3.5 - 5.0 g/dL   AST 20 15 - 41 U/L   ALT 11 0 - 44 U/L   Alkaline Phosphatase 42 38 - 126 U/L   Total Bilirubin 1.1 0.3 - 1.2 mg/dL   GFR calc non Af Amer 42 (L) >60 mL/min   GFR calc Af Amer 49 (L) >60 mL/min  Anion gap 10 5 - 15  Protime-INR     Status: Abnormal   Collection Time: 11/17/18  6:59 AM  Result Value Ref Range   Prothrombin Time 15.3 (H) 11.4 - 15.2 seconds   INR 1.2 0.8 - 1.2  Magnesium     Status: None   Collection Time: 11/17/18  6:59 AM  Result Value Ref Range   Magnesium 2.1 1.7 - 2.4 mg/dL  Prepare RBC     Status: None   Collection Time: 11/17/18  8:28 AM  Result Value Ref Range   Order Confirmation      ORDER  PROCESSED BY BLOOD BANK Performed at [facility], [address] [place], [place] [address]      No results found.  ROS:  As stated above in the HPI otherwise negative.  Blood pressure (!) 112/54, pulse 66, temperature 98 F (36.7 C), temperature source Oral, resp. rate 19, height 5\' 3"  (1.6 m), weight 45.4 kg, SpO2 100 %.    PE: Gen: NAD, Alert and Oriented HEENT:  [place]/AT, EOMI Neck: Supple, no LAD Lungs: CTA Bilaterally CV: RRR without M/G/R ABM: Soft, NTND, +BS Ext: No C/C/E  Assessment/Plan: 1) Symptomatic IDA. 2) DOE. 3) Fatigue. 4) Constipation.   With the findings of her anemia further evaluation with an enteroscopy and colonoscopy will be performed.  Plan: 1) Enteroscopy and colonoscopy with Dr. Loreta Ave tomorrow. [name] D 11/17/2018, 10:32 AM

## 2018-11-17 NOTE — Progress Notes (Signed)
Initial Nutrition Assessment   RD working remotely - Late Entry  DOCUMENTATION CODES:   Underweight  INTERVENTION:    Boost Breeze po TID, each supplement provides 250 kcal and 9 grams of protein  NUTRITION DIAGNOSIS:   Increased nutrient needs related to acute illness as evidenced by estimated needs  GOAL:   Patient will meet greater than or equal to 90% of their needs  MONITOR:   PO intake, Supplement acceptance, Labs, Skin, Weight trends, I & O's  REASON FOR ASSESSMENT:   Malnutrition Screening Tool  ASSESSMENT:   85 yo Female with PMH significant for CAD status post CABG and mitral valve replacement in the past, hypertension and hypothyroidism on Plavix presenting with few weeks history of progressive weakness.   Admit dx include: Generalized weakness [R53.1] Symptomatic anemia [D64.9] Gastrointestinal hemorrhage, unspecified gastrointestinal hemorrhage type [K92.2]  RD unable to obtain nutrition hx from pt. Attempted to call room phone but no answer. Briefly spoke with Eber Jones, RN.  Pt is currently on Clear Liquids; NPO 0600 AM 4/1. GI note reviewed; plan for enteroscopy & colonoscopy. Labs & medications reviewed. Hgb 9.5 (L).  NUTRITION - FOCUSED PHYSICAL EXAM:  Unable to complete, however, highly suspect malnutrition  Diet Order:   Diet Order            Diet NPO time specified  Diet effective now        Diet clear liquid Room service appropriate? Yes; Fluid consistency: Thin  Diet effective now             EDUCATION NEEDS:   Not appropriate for education at this time  Skin:  Skin Assessment: Reviewed RN Assessment  Last BM:  3/31    Intake/Output Summary (Last 24 hours) at 11/17/2018 2132 Last data filed at 11/17/2018 1800 Gross per 24 hour  Intake 1156.92 ml  Output 350 ml  Net 806.92 ml   Height:   Ht Readings from Last 1 Encounters:  11/16/18 5\' 3"  (1.6 m)   Weight:   Wt Readings from Last 1 Encounters:  11/16/18 45.4 kg    BMI:  Body mass index is 17.71 kg/m.  Estimated Nutritional Needs:   Kcal:  1100-1300  Protein:  55-70 gm  Fluid:  >/= 1.5 L  Maureen Chatters, RD, LDN Pager #: [phone] After-Hours Pager #: [phone]

## 2018-11-17 NOTE — TOC Initial Note (Addendum)
Transition of Care [facility]) - Initial/Assessment Note    Patient Details  Name: [name] MRN: [id number] Date of Birth: Dec 01, 1931  [facility] CM/SW Contact:    Epifanio Lesches, RN Phone Number: 11/17/2018, 10:18 AM  Clinical Narrative:   Presents with c/o progressive weakness, LGIB/anemia. Hx of CAD status post CABG and MVR/plavix, hypertension and hypothyroidism. From home alone. PTA independent with ADL's , no DME usage.            Awaiting GI consult....NCM following for TOC  Needs.  Esther Hardy ([address] Karon Dev[place])    [phone]     PCP: Dr.Roreira  Expected Discharge Plan: Home/Self Care(lives alone), Pt states @ d/c will have transportation to home.     Patient Goals and CMS Choice Patient states their goals for this hospitalization and ongoing recovery are:: Go back home to where i can go grocery shopping and  go to church. CMS Medicare.gov Compare Post Acute Care list provided to:: Patient Choice offered to / list presented to : Patient  Expected Discharge Plan and Services Expected Discharge Plan: Home/Self Care(lives alone)   Discharge Planning Services: CM Consult   Living arrangements for the past 2 months: Single Family Home                 DME Arranged: N/A DME Agency: NA HH Arranged: NA HH Agency: NA  Prior Living Arrangements/Services Living arrangements for the past 2 months: Single Family Home Lives with:: Self Patient language and need for interpreter reviewed:: Yes Do you feel safe going back to the place where you live?: Yes      Need for Family Participation in Patient Care: No (Comment) Care giver support system in place?: No (comment)   Criminal Activity/Legal Involvement Pertinent to Current Situation/Hospitalization: No - Comment as needed  Activities of Daily Living Home Assistive Devices/Equipment: None ADL Screening (condition at time of admission) Patient's cognitive ability adequate to safely  complete daily activities?: Yes Is the patient deaf or have difficulty hearing?: No Does the patient have difficulty seeing, even when wearing glasses/contacts?: No Does the patient have difficulty concentrating, remembering, or making decisions?: No Patient able to express need for assistance with ADLs?: Yes Does the patient have difficulty dressing or bathing?: No Independently performs ADLs?: Yes (appropriate for developmental age) Does the patient have difficulty walking or climbing stairs?: Yes Weakness of Legs: Both Weakness of Arms/Hands: None  Permission Sought/Granted Permission sought to share information with : Family Supports, Case Manager Permission granted to share information with : Yes, Verbal Permission Granted  Share Information with NAME: Tanay Skaja (Son), Ciarrah Pettersson (Son)           Emotional Assessment Appearance:: Appears stated age Attitude/Demeanor/Rapport: Engaged, Gracious Affect (typically observed): Accepting Orientation: : Oriented to Self, Oriented to  Time, Oriented to Place, Oriented to Situation Alcohol / Substance Use: Not Applicable Psych Involvement: No (comment)  Admission diagnosis:  Generalized weakness [R53.1] Symptomatic anemia [D64.9] Gastrointestinal hemorrhage, unspecified gastrointestinal hemorrhage type [K92.2] Patient Active Problem List   Diagnosis Date Noted  . Essential hypertension 11/17/2018  . Anemia, blood loss 11/17/2018  . History of aortic valve replacement with bioprosthetic valve 11/17/2018  . GI bleed 11/16/2018   PCP:  Patient, No Pcp Per Pharmacy:   [facility] [place], [place] - [address] [place] [address] Phone: [phone] Fax: [phone]     Social Determinants of Health (SDOH) Interventions  Readmission Risk Interventions No flowsheet data found.

## 2018-11-17 NOTE — TOC Progression Note (Signed)
Transition of Care [facility]) - Progression Note    Patient Details  Name: [name] MRN: [id number] Date of Birth: 1932-01-19  [facility]) CM/SW Contact  Epifanio Lesches, RN Phone Number: 11/17/2018, 7:49 PM  Clinical Narrative:     Plan: Enteroscopy and colonoscopy with Dr. Loreta Ave tomorrow, 11/18/2018. NCM will continue to monitor for TOC needs.  Expected Discharge Plan: Home/Self Care(lives alone)    Expected Discharge Plan and Services Expected Discharge Plan: Home/Self Care(lives alone)   Discharge Planning Services: CM Consult   Living arrangements for the past 2 months: Single Family Home                 DME Arranged: N/A DME Agency: NA HH Arranged: NA HH Agency: NA   Social Determinants of Health (SDOH) Interventions    Readmission Risk Interventions No flowsheet data found.

## 2018-11-18 ENCOUNTER — Inpatient Hospital Stay (HOSPITAL_COMMUNITY): Payer: Medicare Other | Admitting: Registered Nurse

## 2018-11-18 ENCOUNTER — Encounter (HOSPITAL_COMMUNITY)
Admission: EM | Disposition: A | Discharge status: Home / Self Care | Payer: Self-pay | Source: Home / Self Care | Attending: Internal Medicine

## 2018-11-18 ENCOUNTER — Encounter (HOSPITAL_COMMUNITY): Payer: Self-pay

## 2018-11-18 DIAGNOSIS — D500 Iron deficiency anemia secondary to blood loss (chronic): Secondary | ICD-10-CM

## 2018-11-18 HISTORY — PX: COLONOSCOPY WITH PROPOFOL: SHX5780

## 2018-11-18 HISTORY — PX: SCLEROTHERAPY: SHX6841

## 2018-11-18 HISTORY — PX: POLYPECTOMY: SHX5525

## 2018-11-18 HISTORY — PX: ENTEROSCOPY: SHX5533

## 2018-11-18 LAB — BPAM RBC
Blood Product Expiration Date: 202004192359
Blood Product Expiration Date: 202004222359
ISSUE DATE / TIME: 202003302205
ISSUE DATE / TIME: 202003311011
UNIT TYPE AND RH: 5100
Unit Type and Rh: 5100

## 2018-11-18 LAB — COMPREHENSIVE METABOLIC PANEL
ALT: 13 U/L (ref 0–44)
ANION GAP: 9 (ref 5–15)
AST: 25 U/L (ref 15–41)
Albumin: 3.5 g/dL (ref 3.5–5.0)
Alkaline Phosphatase: 47 U/L (ref 38–126)
BUN: 17 mg/dL (ref 8–23)
CO2: 22 mmol/L (ref 22–32)
Calcium: 9.2 mg/dL (ref 8.9–10.3)
Chloride: 103 mmol/L (ref 98–111)
Creatinine, Ser: 1.29 mg/dL — ABNORMAL HIGH (ref 0.44–1.00)
GFR calc non Af Amer: 38 mL/min — ABNORMAL LOW (ref >60)
GFR, EST AFRICAN AMERICAN: 44 mL/min — AB (ref >60)
Glucose, Bld: 71 mg/dL (ref 70–99)
Potassium: 3.9 mmol/L (ref 3.5–5.1)
Sodium: 134 mmol/L — ABNORMAL LOW (ref 135–145)
Total Bilirubin: 1.2 mg/dL (ref 0.3–1.2)
Total Protein: 6.1 g/dL — ABNORMAL LOW (ref 6.5–8.1)

## 2018-11-18 LAB — TYPE AND SCREEN
ABO/RH(D): O POS
Antibody Screen: NEGATIVE
Unit division: 0
Unit division: 0

## 2018-11-18 LAB — CBC
HCT: 29.7 % — ABNORMAL LOW (ref 36.0–46.0)
HEMOGLOBIN: 9.9 g/dL — AB (ref 12.0–15.0)
MCH: 28.9 pg (ref 26.0–34.0)
MCHC: 33.3 g/dL (ref 30.0–36.0)
MCV: 86.6 fL (ref 80.0–100.0)
Platelets: 216 10*3/uL (ref 150–400)
RBC: 3.43 MIL/uL — AB (ref 3.87–5.11)
RDW: 14.7 % (ref 11.5–15.5)
WBC: 6.3 10*3/uL (ref 4.0–10.5)
nRBC: 0 % (ref 0.0–0.2)

## 2018-11-18 SURGERY — COLONOSCOPY WITH PROPOFOL
Anesthesia: Monitor Anesthesia Care

## 2018-11-18 MED ORDER — PROPOFOL 10 MG/ML IV BOLUS
INTRAVENOUS | Status: DC | PRN
  Administered 2018-11-18: 40 mg via INTRAVENOUS
  Administered 2018-11-18: 20 mg via INTRAVENOUS
  Administered 2018-11-18: 10 mg via INTRAVENOUS

## 2018-11-18 MED ORDER — LACTATED RINGERS IV SOLN
INTRAVENOUS | Status: DC | PRN
  Administered 2018-11-18: 12:00:00 via INTRAVENOUS

## 2018-11-18 MED ORDER — SODIUM CHLORIDE (PF) 0.9 % IJ SOLN
PREFILLED_SYRINGE | INTRAMUSCULAR | Status: DC | PRN
  Administered 2018-11-18: 2 mL

## 2018-11-18 MED ORDER — EPHEDRINE SULFATE-NACL 50-0.9 MG/10ML-% IV SOSY
PREFILLED_SYRINGE | INTRAVENOUS | Status: DC | PRN
  Administered 2018-11-18: 5 mg via INTRAVENOUS
  Administered 2018-11-18: 10 mg via INTRAVENOUS

## 2018-11-18 MED ORDER — PROPOFOL 500 MG/50ML IV EMUL
INTRAVENOUS | Status: DC | PRN
  Administered 2018-11-18: 100 ug/kg/min via INTRAVENOUS

## 2018-11-18 MED ORDER — SODIUM CHLORIDE 0.9 % IV SOLN: INTRAVENOUS | Status: DC

## 2018-11-18 MED ORDER — LIDOCAINE 2% (20 MG/ML) 5 ML SYRINGE
INTRAMUSCULAR | Status: DC | PRN
  Administered 2018-11-18: 60 mg via INTRAVENOUS

## 2018-11-18 MED ORDER — EPINEPHRINE PF 1 MG/10ML IJ SOSY
PREFILLED_SYRINGE | INTRAMUSCULAR | Status: AC
  Filled 2018-11-18: qty 10

## 2018-11-18 SURGICAL SUPPLY — 21 items

## 2018-11-18 NOTE — Anesthesia Procedure Notes (Signed)
Date/Time: 11/18/2018 11:53 AM Performed by: Laruth Bouchard., CRNA Pre-anesthesia Checklist: Patient identified, Emergency Drugs available, Suction available, Patient being monitored and Timeout performed Patient Re-evaluated:Patient Re-evaluated prior to induction Oxygen Delivery Method: Nasal cannula Preoxygenation: Pre-oxygenation with 100% oxygen Induction Type: IV induction Placement Confirmation: positive ETCO2

## 2018-11-18 NOTE — Progress Notes (Addendum)
PROGRESS NOTE                                                                                                                                                                                                             Patient Demographics:    [name], is a 85 y.o. female, DOB - [birth year], ZDG:[id number]  Admit date - 11/16/2018   Admitting Physician Carron Curie, MD  Outpatient Primary MD for the patient is Patient, No Pcp Per  LOS - 2  Chief Complaint  Patient presents with  . Weakness       Brief Narrative  [name]  is a 85 y.o. female, with past medical history significant for CAD status post CABG and mitral valve replacement in the past, hypertension and hypothyroidism on Plavix presenting with few weeks history of progressive weakness.  Patient denies any chest pains but reports dizziness.  Denies any nausea vomiting or  diarrhea.  Denies abdominal pain or fever. Patient was sent from Dr. Kenna Gilbert office today after she was noted to have maroonish stool. Work-up in the emergency room showed a hemoglobin of 6.1.   Subjective:   Patient in bed, appears comfortable, denies any headache, no fever, no chest pain or pressure, no shortness of breath , no abdominal pain. No focal weakness.   Assessment  & Plan :     1.  Lower GI bleed with acute blood loss related anemia.  She is status post 2 units of packed RBC transfusion with stable posttransfusion H&H, CT abdomen pelvis nonacute.  Continue PPI, continue to hold dual antiplatelets.  GI to do EGD-Colonoscopy today.  2.  Essential hypertension is currently on Coreg continue will add PRN hydralazine.  3.  History of bioprosthetic aortic valve replacement over 10 years ago at [facility].  Stable supportive care.  4.  Dyslipidemia.  Continue statin.  5.  CAD status post CABG.  On Coreg and statin dual antiplatelets held due to #1 above.    Family Communication  : None  Code Status :  Full  Disposition Plan  :   TBD  Consults  :  GI - Mann  Procedures  :    CT Abd/Pelvis -  Non acute  DVT Prophylaxis  :    SCDs    Lab Results  Component Value Date   PLT 216 11/18/2018    Diet :  Diet Order            Diet NPO time specified  Diet effective now               Inpatient Medications Scheduled Meds: . carvedilol  3.125 mg Oral BID  . feeding supplement  1 Container Oral TID BM  . levothyroxine  50 mcg Oral QHS  . pantoprazole (PROTONIX) IV  40 mg Intravenous Q12H   Continuous Infusions: . sodium chloride 50 mL/hr at 11/18/18 0433   PRN Meds:.hydrALAZINE, ondansetron **OR** ondansetron (ZOFRAN) IV  Antibiotics  :   Anti-infectives (From admission, onward)   None        Objective:   Vitals:   11/17/18 1210 11/17/18 2231 11/18/18 0638 11/18/18 0903  BP: 112/64 (!) 125/59 100/62 122/75  Pulse: (!) 56 (!) 57 (!) 55 74  Resp: 16 16 15    Temp: (!) 97.5 F (36.4 C) (!) 97.5 F (36.4 C) 98.5 F (36.9 C)   TempSrc: Oral Oral Oral   SpO2: 100% 99% 98% 97%  Weight:      Height:        Wt Readings from Last 3 Encounters:  11/16/18 45.4 kg     Intake/Output Summary (Last 24 hours) at 11/18/2018 0909 Last data filed at 11/18/2018 0900 Gross per 24 hour  Intake 1280.38 ml  Output -  Net 1280.38 ml     Physical Exam  Awake Alert, Oriented X 3, No new F.N deficits, Normal affect [place].AT,PERRAL Supple Neck,No JVD, No cervical lymphadenopathy appriciated.  Symmetrical Chest wall movement, Good air movement bilaterally, CTAB RRR,No Gallops, Rubs or new Murmurs, No Parasternal Heave +ve B.Sounds, Abd Soft, No tenderness, No organomegaly appriciated, No rebound - guarding or rigidity. No Cyanosis, Clubbing or edema, No new Rash or bruise    Data Review:    CBC Recent Labs  Lab 11/16/18 1711 11/17/18 0600 11/17/18 1410 11/17/18 2208 11/18/18 0704  WBC 5.2 5.1 5.9 6.6 6.3  HGB 6.1* 7.3* 9.5* 11.0* 9.9*  HCT 20.3* 23.5* 29.7* 32.9* 29.7*  PLT 217 190 212 232 216   MCV 89.0 88.7 87.6 85.5 86.6  MCH 26.8 27.5 28.0 28.6 28.9  MCHC 30.0 31.1 32.0 33.4 33.3  RDW 14.8 14.6 14.2 14.5 14.7  LYMPHSABS 1.3  --   --   --   --   MONOABS 0.5  --   --   --   --   EOSABS 0.2  --   --   --   --   BASOSABS 0.0  --   --   --   --     Chemistries  Recent Labs  Lab 11/16/18 1711 11/17/18 0659 11/18/18 0704  NA 138 140 134*  K 4.5 4.1 3.9  CL 109 109 103  CO2 22 21* 22  GLUCOSE 99 80 71  BUN 26* 19 17  CREATININE 1.14* 1.17* 1.29*  CALCIUM 9.0 9.3 9.2  MG  --  2.1  --   AST  --  20 25  ALT  --  11 13  ALKPHOS  --  42 47  BILITOT  --  1.1 1.2   ------------------------------------------------------------------------------------------------------------------ No results for input(s): CHOL, HDL, LDLCALC, TRIG, CHOLHDL, LDLDIRECT in the last 72 hours.  No results found for: HGBA1C ------------------------------------------------------------------------------------------------------------------ No results for input(s): TSH, T4TOTAL, T3FREE, THYROIDAB in the last 72 hours.  Invalid input(s): FREET3 ------------------------------------------------------------------------------------------------------------------ No results for input(s): VITAMINB12, FOLATE, FERRITIN, TIBC, IRON, RETICCTPCT in the last 72 hours.  Coagulation profile Recent Labs  Lab 11/17/18 0659  INR 1.2    No results for input(s): DDIMER  in the last 72 hours.  Cardiac Enzymes Recent Labs  Lab 11/16/18 1711  TROPONINI <0.03   ------------------------------------------------------------------------------------------------------------------ No results found for: BNP  Micro Results Recent Results (from the past 240 hour(s))  MRSA PCR Screening     Status: None   Collection Time: 11/16/18  9:28 PM  Result Value Ref Range Status   MRSA by PCR NEGATIVE NEGATIVE Final    Comment:        The GeneXpert MRSA Assay (FDA approved for NASAL specimens only), is one component of a  comprehensive MRSA colonization surveillance program. It is not intended to diagnose MRSA infection nor to guide or monitor treatment for MRSA infections. Performed at [facility], [address], [place], [place] [address]     Radiology Reports Ct Abdomen Pelvis W Contrast  Result Date: 11/17/2018 CLINICAL DATA:  Abdominal pain, suspected diverticulitis EXAM: CT ABDOMEN AND PELVIS WITH CONTRAST TECHNIQUE: Multidetector CT imaging of the abdomen and pelvis was performed using the standard protocol following bolus administration of intravenous contrast. Sagittal and coronal MPR images reconstructed from axial data set. CONTRAST:  61mL OMNIPAQUE IOHEXOL 300 MG/ML SOLN IV. Dilute oral contrast. COMPARISON:  None FINDINGS: Lower chest: Probable tiny calcified granuloma RIGHT lung base image 10. Hepatobiliary: Gallbladder and liver normal appearance Pancreas: Normal appearance Spleen: Calcified granulomata within spleen.  No focal mass lesion. Adrenals/Urinary Tract: Adrenal glands, kidneys, ureters, and mildly distended bladder otherwise normal appearance. Stomach/Bowel: Normal appendix. Stomach and bowel loops normal appearance Vascular/Lymphatic: Atherosclerotic calcifications aorta and iliac arteries without aneurysm. No adenopathy. Reproductive: Atrophic uterus and ovaries Other: No free air free fluid. No hernia or acute inflammatory process. Musculoskeletal: Bones demineralized. IMPRESSION: No acute intra-abdominal or intrapelvic abnormalities. Electronically Signed   By: Ulyses Southward M.D.   On: 11/17/2018 12:58    Time Spent in minutes  30   Susa Raring M.D on 11/18/2018 at 9:09 AM  To page go to [email or web address] - password [email or web address]

## 2018-11-18 NOTE — Progress Notes (Signed)
Subjective: Patient is here for an enteroscopy and colonoscopy.  Objective: Vital signs in last 24 hours: Temp:  [97.5 F (36.4 C)-98.5 F (36.9 C)] 98.2 F (36.8 C) (04/01 1123) Pulse Rate:  [55-74] 71 (04/01 1123) Resp:  [15-17] 17 (04/01 1123) BP: (100-139)/(59-83) 139/83 (04/01 1123) SpO2:  [97 %-100 %] 98 % (04/01 1123) Weight:  [45 kg] 45 kg (04/01 1123) Last BM Date: 11/17/18  Intake/Output from previous day: 03/31 0701 - 04/01 0700 In: 1280.4 [I.V.:950.4; Blood:330] Out: -  Intake/Output this shift: No intake/output data recorded.  General appearance: alert, cooperative, appears stated age, no distress and pale Resp: clear to auscultation bilaterally Cardio: regular rate and rhythm, S1, S2 normal, no murmur, click, rub or gallop GI: soft, non-tender; bowel sounds normal; no masses,  no organomegaly  Lab Results: Recent Labs    11/17/18 1410 11/17/18 2208 11/18/18 0704  WBC 5.9 6.6 6.3  HGB 9.5* 11.0* 9.9*  HCT 29.7* 32.9* 29.7*  PLT 212 232 216   BMET Recent Labs    11/16/18 1711 11/17/18 0659 11/18/18 0704  NA 138 140 134*  K 4.5 4.1 3.9  CL 109 109 103  CO2 22 21* 22  GLUCOSE 99 80 71  BUN 26* 19 17  CREATININE 1.14* 1.17* 1.29*  CALCIUM 9.0 9.3 9.2   LFT Recent Labs    11/18/18 0704  PROT 6.1*  ALBUMIN 3.5  AST 25  ALT 13  ALKPHOS 47  BILITOT 1.2   PT/INR Recent Labs    11/17/18 0659  LABPROT 15.3*  INR 1.2   Studies/Results: Ct Abdomen Pelvis W Contrast  Result Date: 11/17/2018 CLINICAL DATA:  Abdominal pain, suspected diverticulitis EXAM: CT ABDOMEN AND PELVIS WITH CONTRAST TECHNIQUE: Multidetector CT imaging of the abdomen and pelvis was performed using the standard protocol following bolus administration of intravenous contrast. Sagittal and coronal MPR images reconstructed from axial data set. CONTRAST:  26mL OMNIPAQUE IOHEXOL 300 MG/ML SOLN IV. Dilute oral contrast. COMPARISON:  None FINDINGS: Lower chest: Probable tiny  calcified granuloma RIGHT lung base image 10. Hepatobiliary: Gallbladder and liver normal appearance Pancreas: Normal appearance Spleen: Calcified granulomata within spleen.  No focal mass lesion. Adrenals/Urinary Tract: Adrenal glands, kidneys, ureters, and mildly distended bladder otherwise normal appearance. Stomach/Bowel: Normal appendix. Stomach and bowel loops normal appearance Vascular/Lymphatic: Atherosclerotic calcifications aorta and iliac arteries without aneurysm. No adenopathy. Reproductive: Atrophic uterus and ovaries Other: No free air free fluid. No hernia or acute inflammatory process. Musculoskeletal: Bones demineralized. IMPRESSION: No acute intra-abdominal or intrapelvic abnormalities. Electronically Signed   By: Ulyses Southward M.D.   On: 11/17/2018 12:58   Medications: I have reviewed the patient's current medications.  Assessment/Plan: Blood in stool/iron deficiency anemia-proceed with a enteroscopy and a colonoscopy at this time.  LOS: 2 days   [name] 11/18/2018, 11:41 AM

## 2018-11-18 NOTE — Op Note (Signed)
[facility] Patient Name: [name] Procedure Date : 11/18/2018 MRN: [id number] Attending MD: Juanita Craver , MD Date of Birth: April 01, 1932 CSN: [id number] Age: 85 Admit Type: Inpatient Procedure:                Small bowel enteroscopy with injection of                            epinephrine at the GEJ. Indications:              Iron deficiency anemia, Hematochezia. Providers:                Juanita Craver, MD, Grace Isaac, RN, Baird Cancer,                            RN, Charolette Child, Technician, Ladona Ridgel,                            Technician, Waynetta Sandy CRNA, Ammie Ferrier Referring MD:             THP Medicines:                Monitored Anesthesia Care Complications:            No immediate complications. Estimated Blood Loss:     Estimated blood loss was minimal. Procedure:                Pre-anesthesia assessment: - Prior to the                            procedure, a history and physical was performed,                            and patient medications and allergies were                            reviewed. The patient's tolerance of previous                            anesthesia was also reviewed. The risks and                            benefits of the procedure and the sedation options                            and risks were discussed with the patient. All                            questions were answered, and informed consent was                            obtained. Prior Anticoagulants: The patient has                            taken Plavix (Clopidogrel), last dose was 3 days  prior to procedure. ASA Grade Assessment: III - A                            patient with severe systemic disease. After                            reviewing the risks and benefits, the patient was                            deemed in satisfactory condition to undergo the                            procedure. After obtaining informed consent,  the                            endoscope was passed under direct vision.                            Throughout the procedure, the patient's blood                            pressure, pulse, and oxygen saturations were                            monitored continuously. The PCF-[id number] ([id number])                            Olympus pediatric colonoscope was introduced                            through the mouth and advanced to the proximal                            jejunum. The small bowel enteroscopy was                            accomplished without difficulty. The patient                            tolerated the procedure well. Scope In: Scope Out: Findings:      The examined esophagus Aand GEJ appeared widely patent and normal; on       withdrawal of the scope a bleeding ?mucosal tear was noted at GEJ-this       area was successfully injected with 2 mL of a 1:10,000 solution of       epinephrine for drug delivery.      A few erosions were found in the gastric antrum; there were no stigmata       of recent bleeding; normal appearing stomach on retroflexion.      There was no evidence of significant pathology in the duodenal bulb, in       the first portion of the duodenum and in the second portion of the       duodenum.      There was no evidence of significant pathology in the proximal jejunum. Impression:               -  Normal appearing widely patent esophagus and GEJ;                            bleeding mucosal tear noted at GEJ on withdrawal of                            the scope-injected with epinephrine 2 cc 1:10, 000.                           - Erosive gastropathy.                           - Normal duodenal bulb, first portion of the                            duodenum and second portion of the duodenum.                           - The examined portion of the jejunum was normal.                           - No specimens collected. Moderate Sedation:      MAC  used Recommendation:           - Full liquid diet today; advance as tolerated.                           - Continue serial CBC's.                           - Avoid the use of all NSAIDS.                           - OP follow up in 1 week. Procedure Code(s):        --- Professional ---                           [phone], Small intestinal endoscopy, enteroscopy                            beyond second portion of duodenum, not including                            ileum; with control of bleeding (eg, injection,                            bipolar cautery, unipolar cautery, laser, heater                            probe, stapler, plasma coagulator)                           44799, Unlisted procedure, small intestine Diagnosis Code(s):        --- Professional ---  K92.1, Melena (includes Hematochezia)                           D50.9, Iron deficiency anemia, unspecified                           K31.89, Other diseases of stomach and duodenum CPT copyright 2019 American [employer] Association. All rights reserved. The codes documented in this report are preliminary and upon coder review may  be revised to meet current compliance requirements. Juanita Craver, MD Juanita Craver, MD 11/18/2018 1:10:27 PM This report has been signed electronically. Number of Addenda: 0

## 2018-11-18 NOTE — Anesthesia Preprocedure Evaluation (Addendum)
Anesthesia Evaluation  Patient identified by MRN, date of birth, ID band Patient awake    Reviewed: Allergy & Precautions, NPO status , Patient's Chart, lab work & pertinent test results, reviewed documented beta blocker date and time   Airway Mallampati: I  TM Distance: >3 FB Neck ROM: Full    Dental no notable dental hx. (+) Dental Advisory Given, Missing,    Pulmonary neg pulmonary ROS,    Pulmonary exam normal breath sounds clear to auscultation       Cardiovascular hypertension, Pt. on medications and Pt. on home beta blockers + Past MI (10/2017)  Normal cardiovascular exam+ Valvular Problems/Murmurs (s/p MVR 2006) MR  Rhythm:Regular Rate:Normal  TTE 2006 EF normal, severe MR   Neuro/Psych negative neurological ROS  negative psych ROS   GI/Hepatic negative GI ROS, Neg liver ROS,   Endo/Other  Hypothyroidism   Renal/GU negative Renal ROS  negative genitourinary   Musculoskeletal negative musculoskeletal ROS (+)   Abdominal   Peds  Hematology  (+) Blood dyscrasia (Hgb 9.9), anemia ,   Anesthesia Other Findings On plavix  Reproductive/Obstetrics negative OB ROS                            Anesthesia Physical Anesthesia Plan  ASA: III  Anesthesia Plan: MAC   Post-op Pain Management:    Induction: Intravenous  PONV Risk Score and Plan: 2 and Propofol infusion and Treatment may vary due to age or medical condition  Airway Management Planned: Natural Airway and Simple Face Mask  Additional Equipment:   Intra-op Plan:   Post-operative Plan:   Informed Consent: I have reviewed the patients History and Physical, chart, labs and discussed the procedure including the risks, benefits and alternatives for the proposed anesthesia with the patient or authorized representative who has indicated his/her understanding and acceptance.     Dental advisory given  Plan Discussed with:  CRNA  Anesthesia Plan Comments:         Anesthesia Quick Evaluation

## 2018-11-18 NOTE — Anesthesia Postprocedure Evaluation (Signed)
Anesthesia Post Note  Patient: [name]  Procedure(s) Performed: COLONOSCOPY WITH PROPOFOL (N/A ) ENTEROSCOPY (N/A ) SCLEROTHERAPY HEMOSTASIS CONTROL POLYPECTOMY     Patient location during evaluation: PACU Anesthesia Type: MAC Level of consciousness: awake and alert Pain management: pain level controlled Vital Signs Assessment: post-procedure vital signs reviewed and stable Respiratory status: spontaneous breathing, nonlabored ventilation, respiratory function stable and patient connected to nasal cannula oxygen Cardiovascular status: stable and blood pressure returned to baseline Postop Assessment: no apparent nausea or vomiting Anesthetic complications: no    Last Vitals:  Vitals:   11/18/18 1314 11/18/18 1419  BP: 120/62 114/62  Pulse: 62 62  Resp:    Temp:  36.4 C  SpO2: 99% 98%    Last Pain:  Vitals:   11/18/18 1419  TempSrc: Oral  PainSc:                  Tiajuana Amass

## 2018-11-18 NOTE — Op Note (Addendum)
[facility] Patient Name: [name] Procedure Date : 11/18/2018 MRN: [id number] Attending MD: Juanita Craver , MD Date of Birth: 06-Apr-1932 CSN: [id number] Age: 85 Admit Type: Inpatient Procedure:                Colonoscopy with snare polypectomy x 2. Indications:              Hematochezia, Iron deficiency anemia, Colorectal                            cancer screening. Providers:                Juanita Craver, MD, Baird Cancer, RN, Grace Isaac,                            RN, Ladona Ridgel, Technician, Charolette Child,                            Technician, Alyson Ingles Technician , Waynetta Sandy CRNA Referring MD:             THP Medicines:                Monitored Anesthesia Care Complications:            No immediate complications. Estimated Blood Loss:     Estimated blood loss: none. Procedure:                Pre-Anesthesia Assessment: - Prior to the                            procedure, a history and physical was performed,                            and patient medications and allergies were                            reviewed. The patient's tolerance of previous                            anesthesia was also reviewed. The risks and                            benefits of the procedure and the sedation options                            and risks were discussed with the patient. All                            questions were answered, and informed consent was                            obtained. Prior Anticoagulants: The patient has                            taken Plavix (clopidogrel), last dose was 3 days  prior to procedure. ASA Grade Assessment: III - A                            patient with severe systemic disease. After                            reviewing the risks and benefits, the patient was                            deemed in satisfactory condition to undergo the                            procedure. After obtaining informed consent, the                             colonoscope was passed under direct vision.                            Throughout the procedure, the patient's blood                            pressure, pulse, and oxygen saturations were                            monitored continuously. The PCF-H190DL ([id number])                            Olympus pediatric colonoscope was introduced                            through the anus and advanced to the the terminal                            ileum, with identification of the appendiceal                            orifice and IC valve. The colonoscopy was performed                            with moderate difficulty due to inadequate bowel                            prep. Successful completion of the procedure was                            aided by lavage. The patient tolerated the                            procedure well. The quality of the bowel                            preparation was adequate. The terminal ileum, the  ileocecal valve, the appendiceal orifice and the                            rectum were photographed. The bowel preparation                            used was NuLytely via single dose instruction. Scope In: 12:13:44 AM Scope Out: 12:42:25 PM Scope Withdrawal Time: 0 hours 21 minutes 7 seconds  Total Procedure Duration: 12 hours 28 minutes 41 seconds  Findings:      A 8 mm sessile polyp was found in the distal ascending colon; the polyp       was removed with a hot snare x 1-200/20; resection and retrieval were       complete.      A 7 mm sessile polyp was found in the mid-sigmoid colon; the polyp was       removed with a hot snare x 1-200/20; resection and retrieval were       complete.      The terminal ileum appeared normal.      Small internal hemorrhoids were noted on retroflexion. Impression:               - One 8 mm sessile polyp in the distal ascending                            colon, removed with a hot snare  x 1; resected and                            retrieved.                           - One 7 mm sessile polyp in the mid-sigmoid colon,                            removed with a hot snare x 1; resected and                            retrieved.                           - The examined portion of the ileum was normal.                           - Small internal hemorrhoids. Moderate Sedation:      MAC used. Recommendation:           - Full liquid diet today; advance as tolerated to a                            low residue diet.                           - Continue present medications; I am still not very                            sure of the source of her rectal bleeding; hold  Plavix for now.                           - Await pathology results.                           - No Aspirin, Ibuprofen, Naproxen, or other                            non-steroidal anti-inflammatory drugs for 10 days                            after polyp removal.                           - Repeat colonoscopy is not recommended for                            surveillance due to patients age.                           - Return to GI office in 1 week.                           - If the patient has any abnormal GI symptoms in                            the interim, she has been advised to call the                            office ASAP for further recommendations. Procedure Code(s):        --- Professional ---                           [phone], Colonoscopy, flexible; with removal of                            tumor(s), polyp(s), or other lesion(s) by snare                            technique Diagnosis Code(s):        --- Professional ---                           D50.9, Iron deficiency anemia, unspecified                           K92.1, Melena (includes Hematochezia)                           K63.5, Polyp of colon                           Z12.11, Encounter for screening for malignant  neoplasm of colon CPT copyright 2019 [employer]. All rights reserved. The codes documented in this report are preliminary and upon coder review may  be revised to meet current compliance requirements. Juanita Craver, MD Juanita Craver, MD 11/18/2018 1:20:55 PM This report has been signed electronically. Number of Addenda: 0

## 2018-11-18 NOTE — Transfer of Care (Signed)
Immediate Anesthesia Transfer of Care Note  Patient: [name]  Procedure(s) Performed: COLONOSCOPY WITH PROPOFOL (N/A ) ENTEROSCOPY (N/A ) SCLEROTHERAPY HEMOSTASIS CONTROL POLYPECTOMY  Patient Location: PACU and Endoscopy Unit  Anesthesia Type:MAC  Level of Consciousness: awake, alert  and oriented  Airway & Oxygen Therapy: Patient Spontanous Breathing  Post-op Assessment: Report given to RN and Post -op Vital signs reviewed and stable  Post vital signs: Reviewed and stable  Last Vitals:  Vitals Value Taken Time  BP    Temp    Pulse    Resp    SpO2      Last Pain:  Vitals:   11/18/18 1123  TempSrc: Oral  PainSc: 0-No pain         Complications: No apparent anesthesia complications

## 2018-11-19 LAB — COMPREHENSIVE METABOLIC PANEL
ALT: 12 U/L (ref 0–44)
AST: 24 U/L (ref 15–41)
Albumin: 3.3 g/dL — ABNORMAL LOW (ref 3.5–5.0)
Alkaline Phosphatase: 46 U/L (ref 38–126)
Anion gap: 6 (ref 5–15)
BUN: 15 mg/dL (ref 8–23)
CO2: 24 mmol/L (ref 22–32)
Calcium: 8.4 mg/dL — ABNORMAL LOW (ref 8.9–10.3)
Chloride: 105 mmol/L (ref 98–111)
Creatinine, Ser: 1.13 mg/dL — ABNORMAL HIGH (ref 0.44–1.00)
GFR calc Af Amer: 51 mL/min — ABNORMAL LOW (ref >60)
GFR calc non Af Amer: 44 mL/min — ABNORMAL LOW (ref >60)
Glucose, Bld: 62 mg/dL — ABNORMAL LOW (ref 70–99)
Potassium: 3.7 mmol/L (ref 3.5–5.1)
Sodium: 135 mmol/L (ref 135–145)
Total Bilirubin: 0.9 mg/dL (ref 0.3–1.2)
Total Protein: 5.4 g/dL — ABNORMAL LOW (ref 6.5–8.1)

## 2018-11-19 LAB — CBC
HCT: 28.6 % — ABNORMAL LOW (ref 36.0–46.0)
Hemoglobin: 9.3 g/dL — ABNORMAL LOW (ref 12.0–15.0)
MCH: 28.9 pg (ref 26.0–34.0)
MCHC: 32.5 g/dL (ref 30.0–36.0)
MCV: 88.8 fL (ref 80.0–100.0)
Platelets: 201 10*3/uL (ref 150–400)
RBC: 3.22 MIL/uL — ABNORMAL LOW (ref 3.87–5.11)
RDW: 14.6 % (ref 11.5–15.5)
WBC: 8.7 10*3/uL (ref 4.0–10.5)
nRBC: 0 % (ref 0.0–0.2)

## 2018-11-19 MED ORDER — PANTOPRAZOLE SODIUM 40 MG PO TBEC: 40.0000 mg | DELAYED_RELEASE_TABLET | Freq: Two times a day (BID) | ORAL | 1 refills | Status: AC

## 2018-11-19 MED ORDER — CLOPIDOGREL BISULFATE 75 MG PO TABS: 75.0000 mg | ORAL_TABLET | Freq: Every day | ORAL | Status: DC

## 2018-11-19 MED ORDER — FERROUS SULFATE 325 (65 FE) MG PO TABS: 325.0000 mg | ORAL_TABLET | Freq: Every day | ORAL | 0 refills | Status: DC

## 2018-11-19 MED ORDER — DOCUSATE SODIUM 100 MG PO CAPS: 100.0000 mg | ORAL_CAPSULE | Freq: Every day | ORAL | 0 refills | Status: AC

## 2018-11-19 MED ORDER — ASPIRIN EC 81 MG PO TBEC: 81.0000 mg | DELAYED_RELEASE_TABLET | Freq: Every day | ORAL | Status: DC

## 2018-11-19 NOTE — Discharge Summary (Signed)
Nancy BrashRuth R [name] ZOX:[id number] DOB: 07/11/1932 DOA: 11/16/2018  PCP: Patient, No Pcp Per  Admit date: 11/16/2018  Discharge date: 11/19/2018  Admitted From: Home   Disposition:  Home   Recommendations for Outpatient Follow-up:   Follow up with PCP in 1-2 weeks  PCP Please obtain BMP/CBC, 2 view CXR in 1week,  (see Discharge instructions)   PCP Please follow up on the following pending results: Monitor CBC   Home Health: PT,RN,Aide, S-Work   Equipment/Devices: None  Consultations: GI Discharge Condition: Stable   CODE STATUS: Full   Diet Recommendation: Heart Healthy     Chief Complaint  Patient presents with  . Weakness     Brief history of present illness from the day of admission and additional interim summary    [name] a87 y.o.female,with past medical history significant for CAD status post CABG and mitral valve replacement in the past, hypertension and hypothyroidism on Plavix presenting with few weeks history of progressive weakness. Patient denies any chest pains but reports dizziness. Denies any nausea vomiting or diarrhea. Denies abdominal pain or fever. Patient was sent from Dr. Kenna GilbertMann's office today after she was noted to have maroonish stool. Work-up in the emergency room showed a hemoglobin of 6.1.                                                                 Hospital Course    1.  Lower GI bleed with acute blood loss related anemia.  She is status post 2 units of packed RBC transfusion with stable posttransfusion H&H, CT abdomen pelvis nonacute.  Continue oral PPI, dual antiplatelets which is gradually increased in a stepwise fashion over the next few days as discussed with GI, H&H stable she is symptom-free, was seen by GI underwent EGD and colonoscopy showing couple of polyps in the  large bowel and a small GE junction mucosal tear which was nonbleeding, she will be discharged home with outpatient PCP and GI follow-up.  2.  Essential hypertension continue home regimen upon discharge.  3.  History of [facility]rost[facility] [place] [facility].  Stable supportive care.  4.  Dyslipidemia.  Continue statin.  5.  CAD status post CABG.  On Coreg and statin dual antiplatelets as in #1 above.   Discharge diagnosis     Active Problems:   GI bleed   Essential hypertension   Anemia, blood loss   History of aortic valve replacement with bioprosthetic valve    Discharge instructions    Discharge Instructions    Diet - low sodium heart healthy   Complete by:  As directed    Discharge instructions   Complete by:  As directed    Follow with Primary MD in 7 days   Get CBC, CMP checked  by Primary MD in 5-7 days  Activity: As tolerated with Full fall precautions use walker/cane & assistance as needed  Disposition Home  Diet: Heart Healthy  Special Instructions: If you have smoked or chewed Tobacco  in the last 2 yrs please stop smoking, stop any regular Alcohol  and or any Recreational drug use.  On your next visit with your primary care physician please Get Medicines reviewed and adjusted.  Please request your Prim.MD to go over all Hospital Tests and Procedure/Radiological results at the follow up, please get all Hospital records sent to your Prim MD by signing hospital release before you go home.  If you experience worsening of your admission symptoms, develop shortness of breath, life threatening emergency, suicidal or homicidal thoughts you must seek medical attention immediately by calling 911 or calling your MD immediately  if symptoms less severe.  You Must read complete instructions/literature along with all the possible adverse reactions/side effects for all the Medicines you take and that have been prescribed to you. Take  any new Medicines after you have completely understood and accpet all the possible adverse reactions/side effects.   Do not drive, operate heavy machinery, perform activities at heights, swimming or participation in water activities or provide baby sitting services if your were admitted for syncope or siezures until you have seen by Primary MD or a Neurologist and advised to do so again.   Increase activity slowly   Complete by:  As directed       Discharge Medications   Allergies as of 11/19/2018   No Known Allergies     Medication List    TAKE these medications   amLODipine 2.5 MG tablet Commonly known as:  NORVASC Take 2.5 mg by mouth 2 (two) times daily.   aspirin EC 81 MG tablet Take 1 tablet (81 mg total) by mouth at bedtime. Start taking on:  November 21, 2018 What changed:  These instructions start on November 21, 2018. If you are unsure what to do until then, ask your doctor or other care provider.   carvedilol 3.125 MG tablet Commonly known as:  COREG Take 3.125 mg by mouth 2 (two) times daily.   clopidogrel 75 MG tablet Commonly known as:  PLAVIX Take 1 tablet (75 mg total) by mouth daily. Start taking on:  November 23, 2018 What changed:  These instructions start on November 23, 2018. If you are unsure what to do until then, ask your doctor or other care provider.   docusate sodium 100 MG capsule Commonly known as:  Colace Take 1 capsule (100 mg total) by mouth daily for 30 days.   ferrous sulfate 325 (65 FE) MG tablet Take 1 tablet (325 mg total) by mouth daily with breakfast.   levothyroxine 50 MCG tablet Commonly known as:  SYNTHROID, LEVOTHROID Take 50 mcg by mouth at bedtime.   losartan 100 MG tablet Commonly known as:  COZAAR Take 100 mg by mouth at bedtime.   nitroGLYCERIN 0.4 MG SL tablet Commonly known as:  NITROSTAT Place 0.4 mg under the tongue every 5 (five) minutes as needed for chest pain.   pantoprazole 40 MG tablet Commonly known as:  Protonix Take 1  tablet (40 mg total) by mouth 2 (two) times daily.   Pitavastatin Calcium 2 MG Tabs Take 2 mg by mouth at bedtime.       Follow-up Information    Your PCP. Schedule an appointment as soon as possible for a visit in 1 week(s).  Charna Elizabeth, MD. Schedule an appointment as soon as possible for a visit in 2 week(s).   Specialty:  Gastroenterology Why:  GI bleed Contact information: [address] [place] [address] [phone]           Major procedures and Radiology Reports - PLEASE review detailed and final reports thoroughly  -         Ct Abdomen Pelvis W Contrast  Result Date: 11/17/2018 CLINICAL DATA:  Abdominal pain, suspected diverticulitis EXAM: CT ABDOMEN AND PELVIS WITH CONTRAST TECHNIQUE: Multidetector CT imaging of the abdomen and pelvis was performed using the standard protocol following bolus administration of intravenous contrast. Sagittal and coronal MPR images reconstructed from axial data set. CONTRAST:  75mL OMNIPAQUE IOHEXOL 300 MG/ML SOLN IV. Dilute oral contrast. COMPARISON:  None FINDINGS: Lower chest: Probable tiny calcified granuloma RIGHT lung base image 10. Hepatobiliary: Gallbladder and liver normal appearance Pancreas: Normal appearance Spleen: Calcified granulomata within spleen.  No focal mass lesion. Adrenals/Urinary Tract: Adrenal glands, kidneys, ureters, and mildly distended bladder otherwise normal appearance. Stomach/Bowel: Normal appendix. Stomach and bowel loops normal appearance Vascular/Lymphatic: Atherosclerotic calcifications aorta and iliac arteries without aneurysm. No adenopathy. Reproductive: Atrophic uterus and ovaries Other: No free air free fluid. No hernia or acute inflammatory process. Musculoskeletal: Bones demineralized. IMPRESSION: No acute intra-abdominal or intrapelvic abnormalities. Electronically Signed   By: Ulyses Southward M.D.   On: 11/17/2018 12:58    Micro Results     Recent Results (from the past  240 hour(s))  MRSA PCR Screening     Status: None   Collection Time: 11/16/18  9:28 PM  Result Value Ref Range Status   MRSA by PCR NEGATIVE NEGATIVE Final    Comment:        The GeneXpert MRSA Assay (FDA approved for NASAL specimens only), is one component of a comprehensive MRSA colonization surveillance program. It is not intended to diagnose MRSA infection nor to guide or monitor treatment for MRSA infections. Performed at [facility], [address] [place], [place] [address]     Today   Subjective    [name] today has no headache,no chest abdominal pain,no new weakness tingling or numbness, feels much better wants to go home today.     Objective   Blood pressure (!) 117/54, pulse (!) 59, temperature 98.1 F (36.7 C), temperature source Oral, resp. rate 18, height 5\' 3"  (1.6 m), weight 45 kg, SpO2 97 %.   Intake/Output Summary (Last 24 hours) at 11/19/2018 0936 Last data filed at 11/19/2018 0552 Gross per 24 hour  Intake 740 ml  Output 0 ml  Net 740 ml    Exam  Awake Alert, Oriented x 3, No new F.N deficits, Normal affect [place].AT,PERRAL Supple Neck,No JVD, No cervical lymphadenopathy appriciated.  Symmetrical Chest wall movement, Good air movement bilaterally, CTAB RRR,No Gallops,Rubs or new Murmurs, No Parasternal Heave +ve B.Sounds, Abd Soft, Non tender, No organomegaly appriciated, No rebound -guarding or rigidity. No Cyanosis, Clubbing or edema, No new Rash or bruise    Data Review   CBC w Diff:  Lab Results  Component Value Date   WBC 8.7 11/19/2018   HGB 9.3 (L) 11/19/2018   HCT 28.6 (L) 11/19/2018   PLT 201 11/19/2018   LYMPHOPCT 25 11/16/2018   MONOPCT 10 11/16/2018   EOSPCT 4 11/16/2018   BASOPCT 0 11/16/2018    CMP:  Lab Results  Component Value Date   NA 135 11/19/2018   K 3.7  11/19/2018   CL 105 11/19/2018   CO2 24 11/19/2018   BUN 15 11/19/2018   CREATININE 1.13 (H) 11/19/2018   PROT 5.4 (L) 11/19/2018   ALBUMIN 3.3  (L) 11/19/2018   BILITOT 0.9 11/19/2018   ALKPHOS 46 11/19/2018   AST 24 11/19/2018   ALT 12 11/19/2018  .   Total Time in preparing paper work, data evaluation and todays exam - 35 minutes  Susa Raring M.D on 11/19/2018 at 9:36 AM  [facility]   Office  [phone]

## 2018-11-19 NOTE — Discharge Instructions (Signed)
Follow with Primary MD in 7 days   Get CBC, CMP checked  by Primary MD in 5-7 days  Activity: As tolerated with Full fall precautions use walker/cane & assistance as needed  Disposition Home  Diet: Heart Healthy  Special Instructions: If you have smoked or chewed Tobacco  in the last 2 yrs please stop smoking, stop any regular Alcohol  and or any Recreational drug use.  On your next visit with your primary care physician please Get Medicines reviewed and adjusted.  Please request your Prim.MD to go over all Hospital Tests and Procedure/Radiological results at the follow up, please get all Hospital records sent to your Prim MD by signing hospital release before you go home.  If you experience worsening of your admission symptoms, develop shortness of breath, life threatening emergency, suicidal or homicidal thoughts you must seek medical attention immediately by calling 911 or calling your MD immediately  if symptoms less severe.  You Must read complete instructions/literature along with all the possible adverse reactions/side effects for all the Medicines you take and that have been prescribed to you. Take any new Medicines after you have completely understood and accpet all the possible adverse reactions/side effects.   Do not drive, operate heavy machinery, perform activities at heights, swimming or participation in water activities or provide baby sitting services if your were admitted for syncope or siezures until you have seen by Primary MD or a Neurologist and advised to do so again.

## 2018-11-19 NOTE — TOC Transition Note (Signed)
Transition of Care [facility]) - CM/SW Discharge Note   Patient Details  Name: [name] MRN: [id number] Date of Birth: 1932/02/02  [facility] of [facility] CM/SW Contact:  Epifanio Lesches, RN Phone Number: 11/19/2018, 10:15 AM   Clinical Narrative:    Transition to home. Pt with transportation to home.    Final next level of care: Home/Self Care     Patient Goals and CMS Choice Patient states their goals for this hospitalization and ongoing recovery are:: Go back home to where i can go grocery shopping and  CMS Medicare.gov Compare Post Acute Care list provided to:: Patient Choice offered to / list presented to : Patient  Discharge Placement                       Discharge Plan and Services   Discharge Planning Services: CM Consult            DME Arranged: N/A DME Agency: NA HH Arranged: NA HH Agency: NA   Social Determinants of Health (SDOH) Interventions     Readmission Risk Interventions No flowsheet data found.

## 2018-11-19 NOTE — Progress Notes (Signed)
Nsg Discharge Note  Admit Date:  11/16/2018 Discharge date: 11/19/2018   Marcia Brash to be D/C'd Home per MD order.  AVS completed.  Copy for chart, and copy for patient signed, and dated. Patient/caregiver able to verbalize understanding.  Discharge Medication: Allergies as of 11/19/2018   No Known Allergies     Medication List    TAKE these medications   amLODipine 2.5 MG tablet Commonly known as:  NORVASC Take 2.5 mg by mouth 2 (two) times daily.   aspirin EC 81 MG tablet Take 1 tablet (81 mg total) by mouth at bedtime. Start taking on:  November 21, 2018 What changed:  These instructions start on November 21, 2018. If you are unsure what to do until then, ask your doctor or other care provider.   carvedilol 3.125 MG tablet Commonly known as:  COREG Take 3.125 mg by mouth 2 (two) times daily.   clopidogrel 75 MG tablet Commonly known as:  PLAVIX Take 1 tablet (75 mg total) by mouth daily. Start taking on:  November 23, 2018 What changed:  These instructions start on November 23, 2018. If you are unsure what to do until then, ask your doctor or other care provider.   docusate sodium 100 MG capsule Commonly known as:  Colace Take 1 capsule (100 mg total) by mouth daily for 30 days.   ferrous sulfate 325 (65 FE) MG tablet Take 1 tablet (325 mg total) by mouth daily with breakfast.   levothyroxine 50 MCG tablet Commonly known as:  SYNTHROID, LEVOTHROID Take 50 mcg by mouth at bedtime.   losartan 100 MG tablet Commonly known as:  COZAAR Take 100 mg by mouth at bedtime.   nitroGLYCERIN 0.4 MG SL tablet Commonly known as:  NITROSTAT Place 0.4 mg under the tongue every 5 (five) minutes as needed for chest pain.   pantoprazole 40 MG tablet Commonly known as:  Protonix Take 1 tablet (40 mg total) by mouth 2 (two) times daily.   Pitavastatin Calcium 2 MG Tabs Take 2 mg by mouth at bedtime.       Discharge Assessment: Vitals:   11/19/18 0508 11/19/18 0850  BP: (!) 115/54 (!)  117/54  Pulse: 61 (!) 59  Resp: 18   Temp: 98.1 F (36.7 C)   SpO2: 97% 97%   Skin clean, dry and intact without evidence of skin break down, no evidence of skin tears noted. IV catheter discontinued intact. Site without signs and symptoms of complications - no redness or edema noted at insertion site, patient denies c/o pain - only slight tenderness at site.  Dressing with slight pressure applied.  D/c Instructions-Education: Discharge instructions given to patient/family with verbalized understanding. D/c education completed with patient/family including follow up instructions, medication list, d/c activities limitations if indicated, with other d/c instructions as indicated by MD - patient able to verbalize understanding, all questions fully answered. Patient instructed to return to ED, call 911, or call MD for any changes in condition.  Patient escorted via WC, and D/C home via private auto.  Lyndal Pulley, RN 11/19/2018 11:10 AM

## 2018-11-30 ENCOUNTER — Encounter (HOSPITAL_COMMUNITY): Payer: Self-pay | Admitting: Emergency Medicine

## 2018-11-30 ENCOUNTER — Emergency Department (HOSPITAL_COMMUNITY): Payer: Medicare Other

## 2018-11-30 ENCOUNTER — Inpatient Hospital Stay (HOSPITAL_COMMUNITY)
Admission: EM | Admit: 2018-11-30 | Discharge: 2018-12-03 | DRG: 920 | Disposition: A | Discharge status: Home / Self Care | Payer: Medicare Other | Attending: Family Medicine | Admitting: Family Medicine

## 2018-11-30 ENCOUNTER — Other Ambulatory Visit: Payer: Self-pay

## 2018-11-30 DIAGNOSIS — E785 Hyperlipidemia, unspecified: Secondary | ICD-10-CM | POA: Diagnosis present

## 2018-11-30 DIAGNOSIS — D62 Acute posthemorrhagic anemia: Secondary | ICD-10-CM | POA: Diagnosis present

## 2018-11-30 DIAGNOSIS — I252 Old myocardial infarction: Secondary | ICD-10-CM | POA: Diagnosis not present

## 2018-11-30 DIAGNOSIS — K921 Melena: Secondary | ICD-10-CM | POA: Diagnosis present

## 2018-11-30 DIAGNOSIS — Z79890 Hormone replacement therapy (postmenopausal): Secondary | ICD-10-CM

## 2018-11-30 DIAGNOSIS — Z951 Presence of aortocoronary bypass graft: Secondary | ICD-10-CM | POA: Diagnosis not present

## 2018-11-30 DIAGNOSIS — Y838 Other surgical procedures as the cause of abnormal reaction of the patient, or of later complication, without mention of misadventure at the time of the procedure: Secondary | ICD-10-CM | POA: Diagnosis present

## 2018-11-30 DIAGNOSIS — I2510 Atherosclerotic heart disease of native coronary artery without angina pectoris: Secondary | ICD-10-CM | POA: Diagnosis present

## 2018-11-30 DIAGNOSIS — I10 Essential (primary) hypertension: Secondary | ICD-10-CM

## 2018-11-30 DIAGNOSIS — I959 Hypotension, unspecified: Secondary | ICD-10-CM | POA: Diagnosis present

## 2018-11-30 DIAGNOSIS — E039 Hypothyroidism, unspecified: Secondary | ICD-10-CM | POA: Diagnosis present

## 2018-11-30 DIAGNOSIS — N179 Acute kidney failure, unspecified: Secondary | ICD-10-CM | POA: Diagnosis present

## 2018-11-30 DIAGNOSIS — Z7982 Long term (current) use of aspirin: Secondary | ICD-10-CM

## 2018-11-30 DIAGNOSIS — K91840 Postprocedural hemorrhage and hematoma of a digestive system organ or structure following a digestive system procedure: Principal | ICD-10-CM | POA: Diagnosis present

## 2018-11-30 DIAGNOSIS — Z7902 Long term (current) use of antithrombotics/antiplatelets: Secondary | ICD-10-CM

## 2018-11-30 DIAGNOSIS — Z8674 Personal history of sudden cardiac arrest: Secondary | ICD-10-CM

## 2018-11-30 DIAGNOSIS — D500 Iron deficiency anemia secondary to blood loss (chronic): Secondary | ICD-10-CM

## 2018-11-30 DIAGNOSIS — Z953 Presence of xenogenic heart valve: Secondary | ICD-10-CM

## 2018-11-30 DIAGNOSIS — I6523 Occlusion and stenosis of bilateral carotid arteries: Secondary | ICD-10-CM | POA: Diagnosis present

## 2018-11-30 DIAGNOSIS — I255 Ischemic cardiomyopathy: Secondary | ICD-10-CM | POA: Diagnosis present

## 2018-11-30 DIAGNOSIS — I129 Hypertensive chronic kidney disease with stage 1 through stage 4 chronic kidney disease, or unspecified chronic kidney disease: Secondary | ICD-10-CM | POA: Diagnosis present

## 2018-11-30 DIAGNOSIS — K625 Hemorrhage of anus and rectum: Secondary | ICD-10-CM

## 2018-11-30 DIAGNOSIS — N189 Chronic kidney disease, unspecified: Secondary | ICD-10-CM | POA: Diagnosis present

## 2018-11-30 DIAGNOSIS — K922 Gastrointestinal hemorrhage, unspecified: Secondary | ICD-10-CM | POA: Diagnosis not present

## 2018-11-30 DIAGNOSIS — Z681 Body mass index (BMI) 19 or less, adult: Secondary | ICD-10-CM | POA: Diagnosis not present

## 2018-11-30 DIAGNOSIS — R64 Cachexia: Secondary | ICD-10-CM | POA: Diagnosis present

## 2018-11-30 HISTORY — DX: Anemia, unspecified: D64.9

## 2018-11-30 HISTORY — DX: Gastro-esophageal reflux disease without esophagitis: K21.9

## 2018-11-30 HISTORY — DX: Chronic kidney disease, unspecified: N18.9

## 2018-11-30 HISTORY — DX: Essential (primary) hypertension: I10

## 2018-11-30 HISTORY — DX: Unspecified osteoarthritis, unspecified site: M19.90

## 2018-11-30 HISTORY — DX: Hypothyroidism, unspecified: E03.9

## 2018-11-30 LAB — COMPREHENSIVE METABOLIC PANEL
ALT: 11 U/L (ref 0–44)
AST: 17 U/L (ref 15–41)
Albumin: 2.8 g/dL — ABNORMAL LOW (ref 3.5–5.0)
Alkaline Phosphatase: 45 U/L (ref 38–126)
Anion gap: 8 (ref 5–15)
BUN: 25 mg/dL — ABNORMAL HIGH (ref 8–23)
CO2: 22 mmol/L (ref 22–32)
Calcium: 8.2 mg/dL — ABNORMAL LOW (ref 8.9–10.3)
Chloride: 109 mmol/L (ref 98–111)
Creatinine, Ser: 1.2 mg/dL — ABNORMAL HIGH (ref 0.44–1.00)
GFR calc Af Amer: 48 mL/min — ABNORMAL LOW (ref >60)
GFR calc non Af Amer: 41 mL/min — ABNORMAL LOW (ref >60)
Glucose, Bld: 164 mg/dL — ABNORMAL HIGH (ref 70–99)
Potassium: 4.8 mmol/L (ref 3.5–5.1)
Sodium: 139 mmol/L (ref 135–145)
Total Bilirubin: 0.4 mg/dL (ref 0.3–1.2)
Total Protein: 4.7 g/dL — ABNORMAL LOW (ref 6.5–8.1)

## 2018-11-30 LAB — CBC
HCT: 23.6 % — ABNORMAL LOW (ref 36.0–46.0)
Hemoglobin: 7.3 g/dL — ABNORMAL LOW (ref 12.0–15.0)
MCH: 29.6 pg (ref 26.0–34.0)
MCHC: 30.9 g/dL (ref 30.0–36.0)
MCV: 95.5 fL (ref 80.0–100.0)
Platelets: 173 10*3/uL (ref 150–400)
RBC: 2.47 MIL/uL — ABNORMAL LOW (ref 3.87–5.11)
RDW: 15.8 % — ABNORMAL HIGH (ref 11.5–15.5)
WBC: 6.4 10*3/uL (ref 4.0–10.5)
nRBC: 0 % (ref 0.0–0.2)

## 2018-11-30 LAB — POC OCCULT BLOOD, ED: Fecal Occult Bld: POSITIVE — AB

## 2018-11-30 LAB — PREPARE RBC (CROSSMATCH)

## 2018-11-30 MED ORDER — SODIUM CHLORIDE 0.9% IV SOLUTION: Freq: Once | INTRAVENOUS | Status: DC

## 2018-11-30 MED ORDER — SODIUM CHLORIDE 0.9 % IV BOLUS
1000.0000 mL | Freq: Once | INTRAVENOUS | Status: AC
  Administered 2018-11-30: 20:00:00 1000 mL via INTRAVENOUS

## 2018-11-30 MED ORDER — IOHEXOL 350 MG/ML SOLN
80.0000 mL | Freq: Once | INTRAVENOUS | Status: AC | PRN
  Administered 2018-11-30: 22:00:00 80 mL via INTRAVENOUS

## 2018-11-30 MED ORDER — SODIUM CHLORIDE 0.9 % IV BOLUS
1000.0000 mL | Freq: Once | INTRAVENOUS | Status: AC
  Administered 2018-11-30: 1000 mL via INTRAVENOUS

## 2018-11-30 NOTE — H&P (Signed)
History and Physical   [name]:[id number] DOB: 04-28-1932 DOA: 11/30/2018  Referring MD/NP/PA: Dr. Adela Lank  PCP: Patient, No Pcp Per   Outpatient Specialists: Dr. Loreta Ave gastroenterology  Patient coming from: Home  Chief Complaint: Rectal bleed  HPI: [name] is a 85 y.o. female with medical history significant of recent GI bleed on November 16, 2018 for which patient underwent EGD and colonoscopy with no obvious source found, hypothyroidism, hypertension, coronary artery disease, hyperlipidemia, who presented to the ER today with another episode of rectal bleeding.  She has had a few episodes at home and had 1 large bloody bowel movement in the ER.  Patient's hemoglobin was 9.3 on the second of this month when she left the hospital.  It is down to 7.3 today.  Denied any fever or chills.  Denied any abdominal pain.  No nausea vomiting or diarrhea.  Patient is therefore being admitted for GI bleed with symptomatic anemia due to dizziness and hypotension..  ED Course: Temperature is 97 for blood pressure 81/40 pulse 87 respiratory rate of 20 oxygen sat 97% room air.  White count 6.4 hemoglobin 7.3 and platelets 173.  BUN is 25 and creatinine 1.20 calcium 8.2 glucose 164.  CT angiogram of the abdomen shows no evidence of acute bleed and no other significant findings.  ER has contacted patient's gastroenterologist and recommendations to admit and they will follow.  Blood transfusion initiated in the ER.  Review of Systems: As per HPI otherwise 10 point review of systems negative.    Past Medical History:  Diagnosis Date  . Complication of anesthesia    cardiac arrest during neck operation   . Myocardial infarction [facility])     Past Surgical History:  Procedure Laterality Date  . CARDIAC SURGERY    . CERVICAL DISCECTOMY    . COLONOSCOPY WITH PROPOFOL N/A 11/18/2018   Procedure: COLONOSCOPY WITH PROPOFOL;  Surgeon: Charna Elizabeth, MD;  Location: [facility] ENDOSCOPY;  Service: Endoscopy;   Laterality: N/A;  . ENTEROSCOPY N/A 11/18/2018   Procedure: ENTEROSCOPY;  Surgeon: Charna Elizabeth, MD;  Location: [facility] ENDOSCOPY;  Service: Endoscopy;  Laterality: N/A;  . POLYPECTOMY  11/18/2018   Procedure: POLYPECTOMY;  Surgeon: Charna Elizabeth, MD;  Location: [facility] ENDOSCOPY;  Service: Endoscopy;;  . Susa Day  11/18/2018   Procedure: Susa Day;  Surgeon: Charna Elizabeth, MD;  Location: [facility] ENDOSCOPY;  Service: Endoscopy;;     reports that she has never smoked. She has never used smokeless tobacco. She reports that she does not drink alcohol or use drugs.  No Known Allergies  No family history on file.   Prior to Admission medications   Medication Sig Start Date End Date Taking? Authorizing Provider  amLODipine (NORVASC) 2.5 MG tablet Take 2.5 mg by mouth 2 (two) times daily. 11/12/18   [provider]  aspirin EC 81 MG tablet Take 1 tablet (81 mg total) by mouth at bedtime. 11/21/18   Leroy Sea, MD  carvedilol (COREG) 3.125 MG tablet Take 3.125 mg by mouth 2 (two) times daily. 07/31/18   [provider]  clopidogrel (PLAVIX) 75 MG tablet Take 1 tablet (75 mg total) by mouth daily. 11/23/18   Leroy Sea, MD  docusate sodium (COLACE) 100 MG capsule Take 1 capsule (100 mg total) by mouth daily for 30 days. 11/19/18 12/19/18  Leroy Sea, MD  ferrous sulfate 325 (65 FE) MG tablet Take 1 tablet (325 mg total) by mouth daily with breakfast. 11/19/18   Leroy Sea,  MD  levothyroxine (SYNTHROID, LEVOTHROID) 50 MCG tablet Take 50 mcg by mouth at bedtime.    [provider]  losartan (COZAAR) 100 MG tablet Take 100 mg by mouth at bedtime. 10/21/18   [provider]  nitroGLYCERIN (NITROSTAT) 0.4 MG SL tablet Place 0.4 mg under the tongue every 5 (five) minutes as needed for chest pain.  08/28/17   [provider]  pantoprazole (PROTONIX) 40 MG tablet Take 1 tablet (40 mg total) by mouth 2 (two) times daily. 11/19/18   Leroy SeaSingh, Prashant K, MD   Pitavastatin Calcium 2 MG TABS Take 2 mg by mouth at bedtime.    [provider]    Physical Exam: Vitals:   11/30/18 2226 11/30/18 2245 11/30/18 2257 11/30/18 2300  BP:  (!) 122/56  101/64  Pulse: 66  64 65  Resp: 17 15 17 20   Temp:      TempSrc:      SpO2: 100%  99% 99%  Weight:      Height:          Constitutional: , cachectic no acute distress Vitals:   11/30/18 2226 11/30/18 2245 11/30/18 2257 11/30/18 2300  BP:  (!) 122/56  101/64  Pulse: 66  64 65  Resp: 17 15 17 20   Temp:      TempSrc:      SpO2: 100%  99% 99%  Weight:      Height:       Eyes: PERRL, lids and conjunctivae pale ENMT: Mucous membranes are moist. Posterior pharynx clear of any exudate or lesions.Normal dentition.  Neck: normal, supple, no masses, no thyromegaly Respiratory: clear to auscultation bilaterally, no wheezing, no crackles. Normal respiratory effort. No accessory muscle use.  Cardiovascular: Regular rate and rhythm, no murmurs / rubs / gallops. No extremity edema. 2+ pedal pulses. No carotid bruits.  Abdomen: no tenderness, no masses palpated. No hepatosplenomegaly. Bowel sounds positive.  Musculoskeletal: no clubbing / cyanosis. No joint deformity upper and lower extremities. Good ROM, no contractures. Normal muscle tone.  Skin: no rashes, lesions, ulcers. No induration Neurologic: CN 2-12 grossly intact. Sensation intact, DTR normal. Strength 5/5 in all 4.  Psychiatric: Normal judgment and insight. Alert and oriented x 3. Normal mood.     Labs on Admission: I have personally reviewed following labs and imaging studies  CBC: Recent Labs  Lab 11/30/18 1937  WBC 6.4  HGB 7.3*  HCT 23.6*  MCV 95.5  PLT 173   Basic Metabolic Panel: Recent Labs  Lab 11/30/18 1937  NA 139  K 4.8  CL 109  CO2 22  GLUCOSE 164*  BUN 25*  CREATININE 1.20*  CALCIUM 8.2*   GFR: Estimated Creatinine Clearance: 24.3 mL/min (A) (by C-G formula based on SCr of 1.2 mg/dL (H)). Liver  Function Tests: Recent Labs  Lab 11/30/18 1937  AST 17  ALT 11  ALKPHOS 45  BILITOT 0.4  PROT 4.7*  ALBUMIN 2.8*   No results for input(s): LIPASE, AMYLASE in the last 168 hours. No results for input(s): AMMONIA in the last 168 hours. Coagulation Profile: No results for input(s): INR, PROTIME in the last 168 hours. Cardiac Enzymes: No results for input(s): CKTOTAL, CKMB, CKMBINDEX, TROPONINI in the last 168 hours. BNP (last 3 results) No results for input(s): PROBNP in the last 8760 hours. HbA1C: No results for input(s): HGBA1C in the last 72 hours. CBG: No results for input(s): GLUCAP in the last 168 hours. Lipid Profile: No results for input(s): CHOL, HDL,  LDLCALC, TRIG, CHOLHDL, LDLDIRECT in the last 72 hours. Thyroid Function Tests: No results for input(s): TSH, T4TOTAL, FREET4, T3FREE, THYROIDAB in the last 72 hours. Anemia Panel: No results for input(s): VITAMINB12, FOLATE, FERRITIN, TIBC, IRON, RETICCTPCT in the last 72 hours. Urine analysis: No results found for: COLORURINE, APPEARANCEUR, LABSPEC, PHURINE, GLUCOSEU, HGBUR, BILIRUBINUR, KETONESUR, PROTEINUR, UROBILINOGEN, NITRITE, LEUKOCYTESUR Sepsis Labs: @LABRCNTIP (procalcitonin:4,lacticidven:4) )No results found for this or any previous visit (from the past 240 hour(s)).   Radiological Exams on Admission: Ct Angio Abd/pel W And/or Wo Contrast  Result Date: 11/30/2018 CLINICAL DATA:  Bright red blood in stools EXAM: CT ANGIOGRAPHY ABDOMEN AND PELVIS TECHNIQUE: Multidetector CT imaging of the abdomen and pelvis was performed using the standard protocol during bolus administration of intravenous contrast. Multiplanar reconstructed images and MIPs were obtained and reviewed to evaluate the vascular anatomy. CONTRAST:  33mL OMNIPAQUE IOHEXOL 350 MG/ML SOLN COMPARISON:  11/17/2018 FINDINGS: VASCULAR Aorta: The abdominal aorta demonstrates atherosclerotic calcifications without aneurysmal dilatation or findings of dissection.  Celiac: Mild atherosclerotic changes are noted at the origin although the celiac axis is widely patent. SMA: Mild atherosclerotic changes are noted at the origin. No focal stenosis is noted. No filling defects are seen. Renals: Atherosclerotic changes are noted at the origins bilaterally although no focal in the abdomen is significant stenosis is seen. IMA: Patent without evidence of aneurysm, dissection, vasculitis or significant stenosis. Iliacs: Mild atherosclerotic changes are noted. No aneurysmal dilatation or stenosis is seen. Veins: No specific venous anomaly is noted. Review of the MIP images confirms the above findings. NON-VASCULAR Lower chest: Lung bases are well aerated without focal infiltrate or sizable effusion. Chronic nodularity is noted in the bases bilaterally. Hepatobiliary: No focal liver abnormality is seen. No gallstones, gallbladder wall thickening, or biliary dilatation. Pancreas: Unremarkable. No pancreatic ductal dilatation or surrounding inflammatory changes. Spleen: Multiple calcified granulomas are noted. Adrenals/Urinary Tract: Adrenal glands are within normal limits. Kidneys are well visualized bilaterally. No renal calculi or obstructive changes are noted. The bladder is partially decompressed. Stomach/Bowel: No obstructive or inflammatory changes of large or small bowel are seen. No areas of contrast pooling are identified to suggest active extravasation. By history, patient has known internal hemorrhoids. These are not well appreciated on this exam. No gastric abnormality is noted. Lymphatic: No lymphadenopathy is seen. Reproductive: Uterus and bilateral adnexa are unremarkable. Other: No abdominal wall hernia or abnormality. No abdominopelvic ascites. Musculoskeletal: Degenerative changes of lumbar spine are noted. IMPRESSION: VASCULAR Atherosclerotic changes without aneurysmal dilatation or dissection. No findings to suggest active extravasation/arterial hemorrhage is seen. By  history the patient has internal hemorrhoids which may account for the bright red blood per rectum. NON-VASCULAR Chronic changes without acute abnormality as described above. Electronically Signed   By: Alcide Clever M.D.   On: 11/30/2018 22:36      Assessment/Plan Principal Problem:   GI bleed Active Problems:   Essential hypertension   Anemia, blood loss   History of aortic valve replacement with bioprosthetic valve   Hypotension   AKI (acute kidney injury) (HCC)   Hypothyroidism     #1  Recurrent GI bleed: Patient had recent EGD and colonoscopy with no obvious source.  She could be having AVM.  Possibly also Dieulafoy's lesion, ischemic, AV fistula etc.  CT angiogram however showed no vascular abnormalities.  We will admit the patient.  IV Protonix.  IV fluids.  Keep her n.p.o.  GI to evaluate patient in the morning.  DC Plavix for now  #2 essential hypertension:  We will hold blood pressure medications due to hypotension.  Monitor closely.  #3 ischemic cardiomyopathy: We will hold patient's Plavix and other cardiac medicine for now.  Monitor on telemetry.  #4 history of aortic valve replacement: Stable.  Resume home medications once stable.  #5 hypothyroidism: Holding oral levothyroxine for now.  If more than 24 hours n.p.o. we will start IV levothyroxine.  #6 symptomatic anemia: Patient is hypotensive and dizzy.  Transfusing 2 units of packed red blood cells.  Follow-up with GI recommendations.    DVT prophylaxis: SCD Code Status: Full code no family at bedside Family Communication: No family at bedside Disposition Plan: To be determined Consults called: Dr. Elnoria Howard, gastroenterology Admission status: Inpatient  Severity of Illness: The appropriate patient status for this patient is INPATIENT. Inpatient status is judged to be reasonable and necessary in order to provide the required intensity of service to ensure the patient's safety. The patient's presenting symptoms,  physical exam findings, and initial radiographic and laboratory data in the context of their chronic comorbidities is felt to place them at high risk for further clinical deterioration. Furthermore, it is not anticipated that the patient will be medically stable for discharge from the hospital within 2 midnights of admission. The following factors support the patient status of inpatient.   " The patient's presenting symptoms include lower GI bleed. " The worrisome physical exam findings include guaiac positive stools. " The initial radiographic and laboratory data are worrisome because of no significant findings on CT but hemoglobin 7.3. " The chronic co-morbidities include recurrent GI bleed and ischemic heart disease.   * I certify that at the point of admission it is my clinical judgment that the patient will require inpatient hospital care spanning beyond 2 midnights from the point of admission due to high intensity of service, high risk for further deterioration and high frequency of surveillance required.Lonia Blood MD [facility] Pager [phone]  If 7PM-7AM, please contact night-coverage [email or web address] Password [email or web address]  11/30/2018, 11:11 PM

## 2018-11-30 NOTE — ED Triage Notes (Signed)
[facility]- pt from home. Pt was here recently for GI bleed complaints. Pt was hospitalized and sent home. Pt here with complaints of bloody stools that are bright red since this weekend.     106/50 60 HR NS  98% RA 20RAC

## 2018-11-30 NOTE — ED Provider Notes (Signed)
[facility] Provider Note   CSN: [id number] Arrival date & time: 11/30/18  1921    History   Chief Complaint No chief complaint on file.   HPI [name] is a 85 y.o. female.     85 yo F with a chief complaint of bright red blood per rectum.  The patient states that since yesterday she has had pretty consistent bloody stool.  Has had 3 episodes in the last hour that she thought were pretty large.  Has some cramping to her lower abdomen just prior to the bowel movements but otherwise denies abdominal pain.  Denies fevers denies vomiting.  Patient was in the hospital 2 weeks ago with a similar presentation.  She had a colonoscopy that showed 2 polyps but no known source of bleeding.  Bleeding stopped spontaneously.  Patient has resumed her dual antiplatelet therapy.  She is started to feel weak on standing and exertion.  The history is provided by the patient.  Illness  Severity:  Moderate Onset quality:  Sudden Duration:  2 days Timing:  Constant Progression:  Worsening Chronicity:  Recurrent Associated symptoms: abdominal pain (cramping prior to bowel movement)   Associated symptoms: no chest pain, no congestion, no fever, no headaches, no myalgias, no nausea, no rhinorrhea, no shortness of breath, no vomiting and no wheezing     Past Medical History:  Diagnosis Date   Complication of anesthesia    cardiac arrest during neck operation    Myocardial infarction [facility](HCC)     Patient Active Problem List   Diagnosis Date Noted   Essential hypertension 11/17/2018   Anemia, blood loss 11/17/2018   History of aortic valve replacement with bioprosthetic valve 11/17/2018   GI bleed 11/16/2018    Past Surgical History:  Procedure Laterality Date   CARDIAC SURGERY     CERVICAL DISCECTOMY     COLONOSCOPY WITH PROPOFOL N/A 11/18/2018   Procedure: COLONOSCOPY WITH PROPOFOL;  Surgeon: Charna ElizabethMann, Jyothi, MD;  [facility];   Service: Endoscopy;  Laterality: N/A;   ENTEROSCOPY N/A 11/18/2018   Procedure: ENTEROSCOPY;  Surgeon: Charna ElizabethMann, Jyothi, MD;  [facility];  Service: Endoscopy;  Laterality: N/A;   POLYPECTOMY  11/18/2018   Procedure: POLYPECTOMY;  Surgeon: Charna ElizabethMann, Jyothi, MD;  [facility];  Service: Endoscopy;;   SCLEROTHERAPY  11/18/2018   Procedure: Susa DaySCLEROTHERAPY;  Surgeon: Charna ElizabethMann, Jyothi, MD;  [facility];  Service: Endoscopy;;     OB History   No obstetric history on file.      Home Medications    Prior to Admission medications   Medication Sig Start Date End Date Taking? Authorizing Provider  amLODipine (NORVASC) 2.5 MG tablet Take 2.5 mg by mouth 2 (two) times daily. 11/12/18   [provider]  aspirin EC 81 MG tablet Take 1 tablet (81 mg total) by mouth at bedtime. 11/21/18   Leroy SeaSingh, Prashant K, MD  carvedilol (COREG) 3.125 MG tablet Take 3.125 mg by mouth 2 (two) times daily. 07/31/18   [provider]  clopidogrel (PLAVIX) 75 MG tablet Take 1 tablet (75 mg total) by mouth daily. 11/23/18   Leroy SeaSingh, Prashant K, MD  docusate sodium (COLACE) 100 MG capsule Take 1 capsule (100 mg total) by mouth daily for 30 days. 11/19/18 12/19/18  Leroy SeaSingh, Prashant K, MD  ferrous sulfate 325 (65 FE) MG tablet Take 1 tablet (325 mg total) by mouth daily with breakfast. 11/19/18   Leroy SeaSingh, Prashant K, MD  levothyroxine (SYNTHROID, LEVOTHROID) 50  MCG tablet Take 50 mcg by mouth at bedtime.    [provider]  losartan (COZAAR) 100 MG tablet Take 100 mg by mouth at bedtime. 10/21/18   [provider]  nitroGLYCERIN (NITROSTAT) 0.4 MG SL tablet Place 0.4 mg under the tongue every 5 (five) minutes as needed for chest pain.  08/28/17   [provider]  pantoprazole (PROTONIX) 40 MG tablet Take 1 tablet (40 mg total) by mouth 2 (two) times daily. 11/19/18   Leroy Sea, MD  Pitavastatin Calcium 2 MG TABS Take 2 mg by mouth at bedtime.    [provider]     Family History No family history on file.  Social History Social History   Tobacco Use   Smoking status: Never Smoker   Smokeless tobacco: Never Used  Substance Use Topics   Alcohol use: Never    Frequency: Never   Drug use: Never     Allergies   Patient has no known allergies.   Review of Systems Review of Systems  Constitutional: Negative for chills and fever.  HENT: Negative for congestion and rhinorrhea.   Eyes: Negative for redness and visual disturbance.  Respiratory: Negative for shortness of breath and wheezing.   Cardiovascular: Negative for chest pain and palpitations.  Gastrointestinal: Positive for abdominal pain (cramping prior to bowel movement) and blood in stool. Negative for nausea and vomiting.  Genitourinary: Negative for dysuria and urgency.  Musculoskeletal: Negative for arthralgias and myalgias.  Skin: Negative for pallor and wound.  Neurological: Negative for dizziness and headaches.     Physical Exam Updated Vital Signs BP 101/64    Pulse 65    Temp (!) 97.5 F (36.4 C) (Oral)    Resp 20    Ht 5\' 3"  (1.6 m)    Wt 45 kg    SpO2 99%    BMI 17.57 kg/m   Physical Exam Vitals signs and nursing note reviewed.  Constitutional:      General: She is not in acute distress.    Appearance: She is well-developed. She is not diaphoretic.  HENT:     Head: Normocephalic and atraumatic.  Eyes:     Pupils: Pupils are equal, round, and reactive to light.  Neck:     Musculoskeletal: Normal range of motion and neck supple.  Cardiovascular:     Rate and Rhythm: Normal rate and regular rhythm.     Heart sounds: No murmur. No friction rub. No gallop.   Pulmonary:     Effort: Pulmonary effort is normal.     Breath sounds: No wheezing or rales.  Abdominal:     General: There is no distension.     Palpations: Abdomen is soft.     Tenderness: There is no abdominal tenderness.  Genitourinary:    Comments: Grossly bloody bowel  movement Musculoskeletal:        General: No tenderness.  Skin:    General: Skin is warm and dry.  Neurological:     Mental Status: She is alert and oriented to person, place, and time.  Psychiatric:        Behavior: Behavior normal.      ED Treatments / Results  Labs (all labs ordered are listed, but only abnormal results are displayed) Labs Reviewed  CBC - Abnormal; Notable for the following components:      Result Value   RBC 2.47 (*)    Hemoglobin 7.3 (*)    HCT 23.6 (*)    RDW 15.8 (*)  All other components within normal limits  COMPREHENSIVE METABOLIC PANEL - Abnormal; Notable for the following components:   Glucose, Bld 164 (*)    BUN 25 (*)    Creatinine, Ser 1.20 (*)    Calcium 8.2 (*)    Total Protein 4.7 (*)    Albumin 2.8 (*)    GFR calc non Af Amer 41 (*)    GFR calc Af Amer 48 (*)    All other components within normal limits  POC OCCULT BLOOD, ED - Abnormal; Notable for the following components:   Fecal Occult Bld POSITIVE (*)    All other components within normal limits  TYPE AND SCREEN  PREPARE RBC (CROSSMATCH)    EKG None  Radiology Ct Angio Abd/pel W And/or Wo Contrast  Result Date: 11/30/2018 CLINICAL DATA:  Bright red blood in stools EXAM: CT ANGIOGRAPHY ABDOMEN AND PELVIS TECHNIQUE: Multidetector CT imaging of the abdomen and pelvis was performed using the standard protocol during bolus administration of intravenous contrast. Multiplanar reconstructed images and MIPs were obtained and reviewed to evaluate the vascular anatomy. CONTRAST:  80mL OMNIPAQUE IOHEXOL 350 MG/ML SOLN COMPARISON:  11/17/2018 FINDINGS: VASCULAR Aorta: The abdominal aorta demonstrates atherosclerotic calcifications without aneurysmal dilatation or findings of dissection. Celiac: Mild atherosclerotic changes are noted at the origin although the celiac axis is widely patent. SMA: Mild atherosclerotic changes are noted at the origin. No focal stenosis is noted. No filling  defects are seen. Renals: Atherosclerotic changes are noted at the origins bilaterally although no focal in the abdomen is significant stenosis is seen. IMA: Patent without evidence of aneurysm, dissection, vasculitis or significant stenosis. Iliacs: Mild atherosclerotic changes are noted. No aneurysmal dilatation or stenosis is seen. Veins: No specific venous anomaly is noted. Review of the MIP images confirms the above findings. NON-VASCULAR Lower chest: Lung bases are well aerated without focal infiltrate or sizable effusion. Chronic nodularity is noted in the bases bilaterally. Hepatobiliary: No focal liver abnormality is seen. No gallstones, gallbladder wall thickening, or biliary dilatation. Pancreas: Unremarkable. No pancreatic ductal dilatation or surrounding inflammatory changes. Spleen: Multiple calcified granulomas are noted. Adrenals/Urinary Tract: Adrenal glands are within normal limits. Kidneys are well visualized bilaterally. No renal calculi or obstructive changes are noted. The bladder is partially decompressed. Stomach/Bowel: No obstructive or inflammatory changes of large or small bowel are seen. No areas of contrast pooling are identified to suggest active extravasation. By history, patient has known internal hemorrhoids. These are not well appreciated on this exam. No gastric abnormality is noted. Lymphatic: No lymphadenopathy is seen. Reproductive: Uterus and bilateral adnexa are unremarkable. Other: No abdominal wall hernia or abnormality. No abdominopelvic ascites. Musculoskeletal: Degenerative changes of lumbar spine are noted. IMPRESSION: VASCULAR Atherosclerotic changes without aneurysmal dilatation or dissection. No findings to suggest active extravasation/arterial hemorrhage is seen. By history the patient has internal hemorrhoids which may account for the bright red blood per rectum. NON-VASCULAR Chronic changes without acute abnormality as described above. Electronically Signed   By:  Alcide Clever M.D.   On: 11/30/2018 22:36    Procedures Procedures (including critical care time)  Medications Ordered in ED Medications  0.9 %  sodium chloride infusion (Manually program via Guardrails IV Fluids) (has no administration in time range)  sodium chloride 0.9 % bolus 1,000 mL (0 mLs Intravenous Stopped 11/30/18 2140)  sodium chloride 0.9 % bolus 1,000 mL (0 mLs Intravenous Stopped 11/30/18 2139)  iohexol (OMNIPAQUE) 350 MG/ML injection 80 mL (80 mLs Intravenous Contrast Given 11/30/18 2204)  Initial Impression / Assessment and Plan / ED Course  I have reviewed the triage vital signs and the nursing notes.  Pertinent labs & imaging results that were available during my care of the patient were reviewed by me and considered in my medical decision making (see chart for details).        85 yo F with a cc of BRBPR.  Going on since yesterday.  Recently in the hospital 2 week ago and had a colonoscopy, endoscopy.  Will obtain labs, give a fluid bolus, reassess.  I discussed case with Dr. Loreta Ave, gastroenterology is recommended doing a CT angiogram of the abdomen pelvis to further evaluate.  Stated that Dr. Elnoria Howard would likely see her in the morning.  Recommended hospitalist admission.  CT without acute source of bleeding.  Given 2U PRBC with improvement of blood pressure.    CRITICAL CARE Performed by: Rae Roam   Total critical care time: 80 minutes  Critical care time was exclusive of separately billable procedures and treating other patients.  Critical care was necessary to treat or prevent imminent or life-threatening deterioration.  Critical care was time spent personally by me on the following activities: development of treatment plan with patient and/or surrogate as well as nursing, discussions with consultants, evaluation of patient's response to treatment, examination of patient, obtaining history from patient or surrogate, ordering and performing treatments  and interventions, ordering and review of laboratory studies, ordering and review of radiographic studies, pulse oximetry and re-evaluation of patient's condition.  The patients results and plan were reviewed and discussed.   Any x-rays performed were independently reviewed by myself.   Differential diagnosis were considered with the presenting HPI.  Medications  0.9 %  sodium chloride infusion (Manually program via Guardrails IV Fluids) (has no administration in time range)  sodium chloride 0.9 % bolus 1,000 mL (0 mLs Intravenous Stopped 11/30/18 2140)  sodium chloride 0.9 % bolus 1,000 mL (0 mLs Intravenous Stopped 11/30/18 2139)  iohexol (OMNIPAQUE) 350 MG/ML injection 80 mL (80 mLs Intravenous Contrast Given 11/30/18 2204)    Vitals:   11/30/18 2226 11/30/18 2245 11/30/18 2257 11/30/18 2300  BP:  (!) 122/56  101/64  Pulse: 66  64 65  Resp: Temp:      TempSrc:      SpO2: 100%  99% 99%  Weight:      Height:        Final diagnoses:  BRBPR (bright red blood per rectum)    Admission/ observation were discussed with the admitting physician, patient and/or family and they are comfortable with the plan.   Final Clinical Impressions(s) / ED Diagnoses   Final diagnoses:  BRBPR (bright red blood per rectum)    ED Discharge Orders    None       Melene Plan, DO 11/30/18 2306

## 2018-11-30 NOTE — ED Notes (Signed)
ED TO INPATIENT HANDOFF REPORT  ED Nurse Name and Phone #: Erin Fulling [phone] Name/Age/Gender [name] 85 y.o. female Room/Bed: 017C/017C  Code Status   Code Status: Prior  Home/SNF/Other Home Patient oriented to: self, place, time and situation Is this baseline? Yes   Triage Complete: Triage complete  Chief Complaint GI bleed  Triage Note [facility]- pt from home. Pt was here recently for GI bleed complaints. Pt was hospitalized and sent home. Pt here with complaints of bloody stools that are bright red since this weekend.     106/50 60 HR NS  98% RA 20RAC   Allergies No Known Allergies  Level of Care/Admitting Diagnosis ED Disposition    ED Disposition Condition Comment   Admit  Hospital Area: [facility] [[id number]]  Level of Care: Progressive [102]  Diagnosis: GI bleed [[id number]]  Admitting Physician: Rometta Emery [[id number]]  Attending Physician: Rometta Emery [[id number]]  Estimated length of stay: past midnight tomorrow  Certification:: I certify this patient will need inpatient services for at least 2 midnights  PT Class (Do Not Modify): Inpatient [101]  PT Acc Code (Do Not Modify): Private [1]       B Medical/Surgery History Past Medical History:  Diagnosis Date  . Complication of anesthesia    cardiac arrest during neck operation   . Myocardial infarction [facility])    Past Surgical History:  Procedure Laterality Date  . CARDIAC SURGERY    . CERVICAL DISCECTOMY    . COLONOSCOPY WITH PROPOFOL N/A 11/18/2018   Procedure: COLONOSCOPY WITH PROPOFOL;  Surgeon: Charna Elizabeth, MD;  Location: [facility] ENDOSCOPY;  Service: Endoscopy;  Laterality: N/A;  . ENTEROSCOPY N/A 11/18/2018   Procedure: ENTEROSCOPY;  Surgeon: Charna Elizabeth, MD;  Location: [facility] ENDOSCOPY;  Service: Endoscopy;  Laterality: N/A;  . POLYPECTOMY  11/18/2018   Procedure: POLYPECTOMY;  Surgeon: Charna Elizabeth, MD;  Location: [facility] ENDOSCOPY;  Service: Endoscopy;;  . Susa Day   11/18/2018   Procedure: Susa Day;  Surgeon: Charna Elizabeth, MD;  Location: [facility] ENDOSCOPY;  Service: Endoscopy;;     A IV Location/Drains/Wounds Patient Lines/Drains/Airways Status   Active Line/Drains/Airways    Name:   Placement date:   Placement time:   Site:   Days:   Peripheral IV 11/30/18 Right Antecubital   11/30/18    2004    Antecubital   less than 1   Peripheral IV 11/30/18 Left Forearm   11/30/18    2042    Forearm   less than 1          Intake/Output Last 24 hours No intake or output data in the 24 hours ending 11/30/18 2340  Labs/Imaging Results for orders placed or performed during the hospital encounter of 11/30/18 (from the past 48 hour(s))  Type and screen     Status: None (Preliminary result)   Collection Time: 11/30/18  7:30 PM  Result Value Ref Range   ABO/RH(D) O POS    Antibody Screen NEG    Sample Expiration 12/03/2018    Unit Number [id number]    Blood Component Type RED CELLS,LR    Unit division 00    Status of Unit ALLOCATED    Transfusion Status OK TO TRANSFUSE    Crossmatch Result Compatible    Unit Number [id number]    Blood Component Type RED CELLS,LR    Unit division 00    Status of Unit ISSUED    Transfusion Status OK TO TRANSFUSE    Crossmatch Result  Compatible Performed at [facility], [address], [place], [place] [address]   CBC     Status: Abnormal   Collection Time: 11/30/18  7:37 PM  Result Value Ref Range   WBC 6.4 4.0 - 10.5 K/uL   RBC 2.47 (L) 3.87 - 5.11 MIL/uL   Hemoglobin 7.3 (L) 12.0 - 15.0 g/dL   HCT 40.9 (L) 81.1 - 91.4 %   MCV 95.5 80.0 - 100.0 fL   MCH 29.6 26.0 - 34.0 pg   MCHC 30.9 30.0 - 36.0 g/dL   RDW 78.2 (H) 95.6 - 21.3 %   Platelets 173 150 - 400 K/uL   nRBC 0.0 0.0 - 0.2 %    Comment: Performed at [facility], [address], [place], [place] [address]  Comprehensive metabolic panel     Status: Abnormal   Collection Time: 11/30/18  7:37 PM  Result Value Ref Range   Sodium  139 135 - 145 mmol/L   Potassium 4.8 3.5 - 5.1 mmol/L   Chloride 109 98 - 111 mmol/L   CO2 22 22 - 32 mmol/L   Glucose, Bld 164 (H) 70 - 99 mg/dL   BUN 25 (H) 8 - 23 mg/dL   Creatinine, Ser 8.46 (H) 0.44 - 1.00 mg/dL   Calcium 8.2 (L) 8.9 - 10.3 mg/dL   Total Protein 4.7 (L) 6.5 - 8.1 g/dL   Albumin 2.8 (L) 3.5 - 5.0 g/dL   AST 17 15 - 41 U/L   ALT 11 0 - 44 U/L   Alkaline Phosphatase 45 38 - 126 U/L   Total Bilirubin 0.4 0.3 - 1.2 mg/dL   GFR calc non Af Amer 41 (L) >60 mL/min   GFR calc Af Amer 48 (L) >60 mL/min   Anion gap 8 5 - 15    Comment: Performed at [facility], [address], [place], [place] [address]  POC occult blood, ED RN will collect     Status: Abnormal   Collection Time: 11/30/18  8:06 PM  Result Value Ref Range   Fecal Occult Bld POSITIVE (A) NEGATIVE  Prepare RBC     Status: None   Collection Time: 11/30/18  8:27 PM  Result Value Ref Range   Order Confirmation      ORDER PROCESSED BY BLOOD BANK Performed at [facility], [address], [place], [place] [address]    Ct Angio Abd/pel W And/or Wo Contrast  Result Date: 11/30/2018 CLINICAL DATA:  Bright red blood in stools EXAM: CT ANGIOGRAPHY ABDOMEN AND PELVIS TECHNIQUE: Multidetector CT imaging of the abdomen and pelvis was performed using the standard protocol during bolus administration of intravenous contrast. Multiplanar reconstructed images and MIPs were obtained and reviewed to evaluate the vascular anatomy. CONTRAST:  69mL OMNIPAQUE IOHEXOL 350 MG/ML SOLN COMPARISON:  11/17/2018 FINDINGS: VASCULAR Aorta: The abdominal aorta demonstrates atherosclerotic calcifications without aneurysmal dilatation or findings of dissection. Celiac: Mild atherosclerotic changes are noted at the origin although the celiac axis is widely patent. SMA: Mild atherosclerotic changes are noted at the origin. No focal stenosis is noted. No filling defects are seen. Renals: Atherosclerotic changes are noted at the  origins bilaterally although no focal in the abdomen is significant stenosis is seen. IMA: Patent without evidence of aneurysm, dissection, vasculitis or significant stenosis. Iliacs: Mild atherosclerotic changes are noted. No aneurysmal dilatation or stenosis is seen. Veins: No specific venous anomaly is noted. Review of the MIP images confirms the above findings.  NON-VASCULAR Lower chest: Lung bases are well aerated without focal infiltrate or sizable effusion. Chronic nodularity is noted in the bases bilaterally. Hepatobiliary: No focal liver abnormality is seen. No gallstones, gallbladder wall thickening, or biliary dilatation. Pancreas: Unremarkable. No pancreatic ductal dilatation or surrounding inflammatory changes. Spleen: Multiple calcified granulomas are noted. Adrenals/Urinary Tract: Adrenal glands are within normal limits. Kidneys are well visualized bilaterally. No renal calculi or obstructive changes are noted. The bladder is partially decompressed. Stomach/Bowel: No obstructive or inflammatory changes of large or small bowel are seen. No areas of contrast pooling are identified to suggest active extravasation. By history, patient has known internal hemorrhoids. These are not well appreciated on this exam. No gastric abnormality is noted. Lymphatic: No lymphadenopathy is seen. Reproductive: Uterus and bilateral adnexa are unremarkable. Other: No abdominal wall hernia or abnormality. No abdominopelvic ascites. Musculoskeletal: Degenerative changes of lumbar spine are noted. IMPRESSION: VASCULAR Atherosclerotic changes without aneurysmal dilatation or dissection. No findings to suggest active extravasation/arterial hemorrhage is seen. By history the patient has internal hemorrhoids which may account for the bright red blood per rectum. NON-VASCULAR Chronic changes without acute abnormality as described above. Electronically Signed   By: Alcide CleverMark  Lukens M.D.   On: 11/30/2018 22:36    Pending  Labs [employer]ed Labs (From admission, onward)    Start     Ordered   Signed and Held  Comprehensive metabolic panel  Tomorrow morning,   R     Signed and Held   Signed and Held  CBC  Now then every 6 hours,   R     Signed and Held          Vitals/Pain Today's Vitals   11/30/18 2226 11/30/18 2245 11/30/18 2257 11/30/18 2300  BP:  (!) 122/56  101/64  Pulse: 66  64 65  Resp: 17 15 17 20   Temp:      TempSrc:      SpO2: 100%  99% 99%  Weight:      Height:      PainSc:        Isolation Precautions No active isolations  Medications Medications  0.9 %  sodium chloride infusion (Manually program via Guardrails IV Fluids) (has no administration in time range)  sodium chloride 0.9 % bolus 1,000 mL (0 mLs Intravenous Stopped 11/30/18 2140)  sodium chloride 0.9 % bolus 1,000 mL (0 mLs Intravenous Stopped 11/30/18 2139)  iohexol (OMNIPAQUE) 350 MG/ML injection 80 mL (80 mLs Intravenous Contrast Given 11/30/18 2204)    Mobility walks Moderate fall risk   Focused Assessments Alert oriented x 4    R Recommendations: See Admitting Provider Note  Report given to:   Additional Notes  Pt is from [facility] and alert x 4. Noticed blood in stool tonight x 4 occurances

## 2018-12-01 ENCOUNTER — Encounter (HOSPITAL_COMMUNITY): Payer: Self-pay

## 2018-12-01 DIAGNOSIS — Z953 Presence of xenogenic heart valve: Secondary | ICD-10-CM

## 2018-12-01 LAB — CBC
HCT: 27.1 % — ABNORMAL LOW (ref 36.0–46.0)
HCT: 27.8 % — ABNORMAL LOW (ref 36.0–46.0)
HCT: 26.6 % — ABNORMAL LOW (ref 36.0–46.0)
Hemoglobin: 8.8 g/dL — ABNORMAL LOW (ref 12.0–15.0)
Hemoglobin: 9 g/dL — ABNORMAL LOW (ref 12.0–15.0)
Hemoglobin: 8.4 g/dL — ABNORMAL LOW (ref 12.0–15.0)
MCH: 29.3 pg (ref 26.0–34.0)
MCH: 28.6 pg (ref 26.0–34.0)
MCH: 28.5 pg (ref 26.0–34.0)
MCHC: 32.5 g/dL (ref 30.0–36.0)
MCHC: 32.4 g/dL (ref 30.0–36.0)
MCHC: 31.6 g/dL (ref 30.0–36.0)
MCV: 90.3 fL (ref 80.0–100.0)
MCV: 88.3 fL (ref 80.0–100.0)
MCV: 90.2 fL (ref 80.0–100.0)
Platelets: 116 10*3/uL — ABNORMAL LOW (ref 150–400)
Platelets: 123 10*3/uL — ABNORMAL LOW (ref 150–400)
Platelets: 115 10*3/uL — ABNORMAL LOW (ref 150–400)
RBC: 3 MIL/uL — ABNORMAL LOW (ref 3.87–5.11)
RBC: 3.15 MIL/uL — ABNORMAL LOW (ref 3.87–5.11)
RBC: 2.95 MIL/uL — ABNORMAL LOW (ref 3.87–5.11)
RDW: 15.9 % — ABNORMAL HIGH (ref 11.5–15.5)
RDW: 15.9 % — ABNORMAL HIGH (ref 11.5–15.5)
RDW: 16.1 % — ABNORMAL HIGH (ref 11.5–15.5)
WBC: 6.1 10*3/uL (ref 4.0–10.5)
WBC: 6.1 10*3/uL (ref 4.0–10.5)
WBC: 5.5 10*3/uL (ref 4.0–10.5)
nRBC: 0 % (ref 0.0–0.2)
nRBC: 0 % (ref 0.0–0.2)
nRBC: 0 % (ref 0.0–0.2)

## 2018-12-01 LAB — COMPREHENSIVE METABOLIC PANEL
ALT: 10 U/L (ref 0–44)
AST: 17 U/L (ref 15–41)
Albumin: 2.6 g/dL — ABNORMAL LOW (ref 3.5–5.0)
Alkaline Phosphatase: 32 U/L — ABNORMAL LOW (ref 38–126)
Anion gap: 7 (ref 5–15)
BUN: 18 mg/dL (ref 8–23)
CO2: 21 mmol/L — ABNORMAL LOW (ref 22–32)
Calcium: 8.2 mg/dL — ABNORMAL LOW (ref 8.9–10.3)
Chloride: 113 mmol/L — ABNORMAL HIGH (ref 98–111)
Creatinine, Ser: 1.14 mg/dL — ABNORMAL HIGH (ref 0.44–1.00)
GFR calc Af Amer: 51 mL/min — ABNORMAL LOW (ref >60)
GFR calc non Af Amer: 44 mL/min — ABNORMAL LOW (ref >60)
Glucose, Bld: 77 mg/dL (ref 70–99)
Potassium: 4.1 mmol/L (ref 3.5–5.1)
Sodium: 141 mmol/L (ref 135–145)
Total Bilirubin: 1.3 mg/dL — ABNORMAL HIGH (ref 0.3–1.2)
Total Protein: 4.6 g/dL — ABNORMAL LOW (ref 6.5–8.1)

## 2018-12-01 MED ORDER — ONDANSETRON HCL 4 MG PO TABS: 4.0000 mg | ORAL_TABLET | Freq: Four times a day (QID) | ORAL | Status: DC | PRN

## 2018-12-01 MED ORDER — PANTOPRAZOLE SODIUM 40 MG IV SOLR: 40.0000 mg | Freq: Two times a day (BID) | INTRAVENOUS | Status: DC

## 2018-12-01 MED ORDER — SODIUM CHLORIDE 0.9 % IV SOLN
80.0000 mg | Freq: Once | INTRAVENOUS | Status: AC
  Administered 2018-12-01: 80 mg via INTRAVENOUS
  Filled 2018-12-01: qty 80

## 2018-12-01 MED ORDER — SODIUM CHLORIDE 0.9 % IV SOLN
INTRAVENOUS | Status: DC
  Administered 2018-12-01: 1000 mL via INTRAVENOUS
  Administered 2018-12-01: 16:00:00 75 mL via INTRAVENOUS
  Administered 2018-12-02 (×2): via INTRAVENOUS

## 2018-12-01 MED ORDER — SODIUM CHLORIDE 0.9 % IV SOLN
8.0000 mg/h | INTRAVENOUS | Status: DC
  Administered 2018-12-01 – 2018-12-03 (×5): 8 mg/h via INTRAVENOUS
  Filled 2018-12-01 (×6): qty 80

## 2018-12-01 MED ORDER — ONDANSETRON HCL 4 MG/2ML IJ SOLN: 4.0000 mg | Freq: Four times a day (QID) | INTRAMUSCULAR | Status: DC | PRN

## 2018-12-01 NOTE — Progress Notes (Signed)
Arrived to Rm 537 via stretcher on room air.  Pt able to stand and pivot from stretcher to bed. No acute distress noted. Very pleasant and chatty.

## 2018-12-01 NOTE — Progress Notes (Signed)
2nd unit of blood started.  VSS. No acute distress noted.

## 2018-12-01 NOTE — Progress Notes (Signed)
Subjective: No further bleeding since admission last evening.  Objective: Vital signs in last 24 hours: Temp:  [97.4 F (36.3 C)-99 F (37.2 C)] 99 F (37.2 C) (04/14 1152) Pulse Rate:  [53-87] 59 (04/14 0452) Resp:  [13-20] 19 (04/14 0452) BP: (81-135)/(33-80) 123/54 (04/14 0452) SpO2:  [97 %-100 %] 99 % (04/14 0452) Weight:  [45 kg] 45 kg (04/13 1927)    Intake/Output from previous day: 04/13 0701 - 04/14 0700 In: 1745.2 [I.V.:1276.2; Blood:369; IV Piggyback:100] Out: -  Intake/Output this shift: No intake/output data recorded.  General appearance: alert and no distress GI: soft, non-tender; bowel sounds normal; no masses,  no organomegaly  Lab Results: Recent Labs    11/30/18 1937 12/01/18 0723 12/01/18 1235  WBC 6.4 6.1 6.1  HGB 7.3* 8.8* 9.0*  HCT 23.6* 27.1* 27.8*  PLT 173 116* 123*   BMET Recent Labs    11/30/18 1937 12/01/18 0723  NA 139 141  K 4.8 4.1  CL 109 113*  CO2 22 21*  GLUCOSE 164* 77  BUN 25* 18  CREATININE 1.20* 1.14*  CALCIUM 8.2* 8.2*   LFT Recent Labs    12/01/18 0723  PROT 4.6*  ALBUMIN 2.6*  AST 17  ALT 10  ALKPHOS 32*  BILITOT 1.3*   PT/INR No results for input(s): LABPROT, INR in the last 72 hours. Hepatitis Panel No results for input(s): HEPBSAG, HCVAB, HEPAIGM, HEPBIGM in the last 72 hours. C-Diff No results for input(s): CDIFFTOX in the last 72 hours. Fecal Lactopherrin No results for input(s): FECLLACTOFRN in the last 72 hours.  Studies/Results: Ct Angio Abd/pel W And/or Wo Contrast  Result Date: 11/30/2018 CLINICAL DATA:  Bright red blood in stools EXAM: CT ANGIOGRAPHY ABDOMEN AND PELVIS TECHNIQUE: Multidetector CT imaging of the abdomen and pelvis was performed using the standard protocol during bolus administration of intravenous contrast. Multiplanar reconstructed images and MIPs were obtained and reviewed to evaluate the vascular anatomy. CONTRAST:  80mL OMNIPAQUE IOHEXOL 350 MG/ML SOLN COMPARISON:   11/17/2018 FINDINGS: VASCULAR Aorta: The abdominal aorta demonstrates atherosclerotic calcifications without aneurysmal dilatation or findings of dissection. Celiac: Mild atherosclerotic changes are noted at the origin although the celiac axis is widely patent. SMA: Mild atherosclerotic changes are noted at the origin. No focal stenosis is noted. No filling defects are seen. Renals: Atherosclerotic changes are noted at the origins bilaterally although no focal in the abdomen is significant stenosis is seen. IMA: Patent without evidence of aneurysm, dissection, vasculitis or significant stenosis. Iliacs: Mild atherosclerotic changes are noted. No aneurysmal dilatation or stenosis is seen. Veins: No specific venous anomaly is noted. Review of the MIP images confirms the above findings. NON-VASCULAR Lower chest: Lung bases are well aerated without focal infiltrate or sizable effusion. Chronic nodularity is noted in the bases bilaterally. Hepatobiliary: No focal liver abnormality is seen. No gallstones, gallbladder wall thickening, or biliary dilatation. Pancreas: Unremarkable. No pancreatic ductal dilatation or surrounding inflammatory changes. Spleen: Multiple calcified granulomas are noted. Adrenals/Urinary Tract: Adrenal glands are within normal limits. Kidneys are well visualized bilaterally. No renal calculi or obstructive changes are noted. The bladder is partially decompressed. Stomach/Bowel: No obstructive or inflammatory changes of large or small bowel are seen. No areas of contrast pooling are identified to suggest active extravasation. By history, patient has known internal hemorrhoids. These are not well appreciated on this exam. No gastric abnormality is noted. Lymphatic: No lymphadenopathy is seen. Reproductive: Uterus and bilateral adnexa are unremarkable. Other: No abdominal wall hernia or abnormality. No abdominopelvic ascites.  Musculoskeletal: Degenerative changes of lumbar spine are noted.  IMPRESSION: VASCULAR Atherosclerotic changes without aneurysmal dilatation or dissection. No findings to suggest active extravasation/arterial hemorrhage is seen. By history the patient has internal hemorrhoids which may account for the bright red blood per rectum. NON-VASCULAR Chronic changes without acute abnormality as described above. Electronically Signed   By: Alcide Clever M.D.   On: 11/30/2018 22:36    Medications:  Scheduled: . sodium chloride   Intravenous Once  . [START ON 12/04/2018] pantoprazole  40 mg Intravenous Q12H   Continuous: . sodium chloride 75 mL (12/01/18 1623)  . pantoprozole (PROTONIX) infusion 8 mg/hr (12/01/18 1624)    Assessment/Plan: 1) Hematochezia.  Probable post-polypectomy bleed. 2) Anemia.   The patient was recently hospitalized for IDA.  Her symptoms presented with symptomatic anemia and her admission HGB on 11/16/2018 at 6.1 g/dL.  She was transfused up to 11.0 g/dL and her enteroscopy/colonoscopy was negative for any over bleeding site.  However, a couple of polyps were identified and removed with a hot snare by Dr. Loreta Ave.  The ascending colon polyp was mildly larger.  As instructed she held her Plavix for the prescribed time.  The patient represented with three bouts of hematochezia, which was different from her prior presentation of melenic stools.  It is likely that she had a post polypectomy bleed precipitated by Plavix, even though the CTA was negative.  Currently she is stable and she denies any further bleeding and she is hemodynamically stable.  Plan: 1) Follow HGB and transfuse as necessary. 2) If she continues to bleed than a repeat colonoscopy will be required. 3) Continue to hold Plavix.  LOS: 1 day   [name] D 12/01/2018, 5:27 PM

## 2018-12-01 NOTE — Evaluation (Signed)
Physical Therapy Evaluation Patient Details Name: [name] MRN: [id number] DOB: [birth year] Today's Date: 12/01/2018   History of Present Illness  Pt is an 85 y/o female who presents with rectal bleeding. Pt recently admitted 3/30-4/2 for GI bleed. PMH significant for MI, HTN, CKD.  Clinical Impression  Pt admitted with above diagnosis. Pt currently with functional limitations due to the deficits listed below (see PT Problem List). At the time of PT eval pt was able to perform transfers and ambulation with gross modified independence, and occasional supervision for safety during gait training. Recommend pt have staff present for ambulation to bathroom, especially if still connected to IV pole and monitor. Would be safe for SPT bed<>BSC, however. Anticipate pt will progress well with mobility. Acutely, pt will benefit from skilled PT to increase their independence and safety with mobility to allow discharge to the venue listed below.     Follow Up Recommendations No PT follow up;Supervision for mobility/OOB    Equipment Recommendations  None recommended by PT    Recommendations for Other Services       Precautions / Restrictions Precautions Precautions: Fall Restrictions Weight Bearing Restrictions: No      Mobility  Bed Mobility Overal bed mobility: Modified Independent Bed Mobility: Supine to Sit           General bed mobility comments: Pt was able to transition to EOB with HOB slightly elevated; no use of rails required.   Transfers Overall transfer level: Needs assistance Equipment used: None Transfers: Sit to/from Stand Sit to Stand: Modified independent (Device/Increase time)         General transfer comment: No assist to power-up to full stand. Pt was able to achieve full standing and gain/maintain balance without assist.   Ambulation/Gait Ambulation/Gait assistance: Supervision Gait Distance (Feet): 200 Feet Assistive device: None Gait Pattern/deviations:  Step-through pattern;Decreased stride length;Trunk flexed Gait velocity: Decreased Gait velocity interpretation: <1.8 ft/sec, indicate of risk for recurrent falls General Gait Details: Pt ambulating well with occasional side step 2 imbalance. Pt able to correct without assistance however supervision provided for safety.   Stairs            Wheelchair Mobility    Modified Rankin (Stroke Patients Only)       Balance Overall balance assessment: Mild deficits observed, not formally tested Sitting-balance support: Feet supported;No upper extremity supported Sitting balance-Leahy Scale: Good     Standing balance support: No upper extremity supported;During functional activity Standing balance-Leahy Scale: Fair                               Pertinent Vitals/Pain Pain Assessment: No/denies pain    Home Living Family/patient expects to be discharged to:: Private residence Living Arrangements: Alone Available Help at Discharge: Family;Available 24 hours/day Type of Home: House Home Access: Stairs to enter Entrance Stairs-Rails: Right Entrance Stairs-Number of Steps: 2-3 Home Layout: One level Home Equipment: Walker - 2 wheels;Cane - single point      Prior Function Level of Independence: Independent               Hand Dominance        Extremity/Trunk Assessment   Upper Extremity Assessment Upper Extremity Assessment: Defer to OT evaluation    Lower Extremity Assessment Lower Extremity Assessment: Overall WFL for tasks assessed    Cervical / Trunk Assessment Cervical / Trunk Assessment: Other exceptions Cervical / Trunk Exceptions: Forward head posture with rounded  shoulders  Communication   Communication: No difficulties  Cognition Arousal/Alertness: Awake/alert Behavior During Therapy: WFL for tasks assessed/performed Overall Cognitive Status: Within Functional Limits for tasks assessed                                         General Comments      Exercises     Assessment/Plan    PT Assessment Patient needs continued PT services  PT Problem List Decreased balance;Decreased mobility;Decreased knowledge of use of DME;Decreased safety awareness       PT Treatment Interventions DME instruction;Gait training;Stair training;Functional mobility training;Therapeutic activities;Therapeutic exercise;Neuromuscular re-education;Patient/family education    PT Goals (Current goals can be found in the Care Plan section)  Acute Rehab PT Goals Patient Stated Goal: Return home as soon as possible PT Goal Formulation: With patient Time For Goal Achievement: 12/08/18 Potential to Achieve Goals: Good    Frequency Min 3X/week   Barriers to discharge        Co-evaluation               AM-PAC PT "6 Clicks" Mobility  Outcome Measure Help needed turning from your back to your side while in a flat bed without using bedrails?: None Help needed moving from lying on your back to sitting on the side of a flat bed without using bedrails?: None Help needed moving to and from a bed to a chair (including a wheelchair)?: None Help needed standing up from a chair using your arms (e.g., wheelchair or bedside chair)?: None Help needed to walk in hospital room?: A Little Help needed climbing 3-5 steps with a railing? : A Little 6 Click Score: 22    End of Session Equipment Utilized During Treatment: Gait belt Activity Tolerance: Patient tolerated treatment well Patient left: in chair;with call bell/phone within reach;with chair alarm set Nurse Communication: Mobility status PT Visit Diagnosis: Unsteadiness on feet (R26.81)    Time: [id number] PT Time Calculation (min) (ACUTE ONLY): 27 min   Charges:   PT Evaluation $PT Eval Moderate Complexity: 1 Mod PT Treatments $Gait Training: 8-22 mins        Conni SlipperLaura Tammie Yanda, PT, DPT Acute Rehabilitation Services Pager: [phone] Office: [phone]   Marylynn PearsonLaura D  Tarun Patchell 12/01/2018, 2:38 PM

## 2018-12-01 NOTE — Progress Notes (Signed)
2nd unit of PRBCs finished.  VSS no acute distress noted. No s/s of transfusion reaction noted.  NS gtt started at 100 cc/hr as per orders.  Protonix bolus given as per orders and Protonix gtt started as per orders.

## 2018-12-01 NOTE — ED Notes (Addendum)
315 ml Can't get into correct chart to correct blood volume

## 2018-12-01 NOTE — Progress Notes (Signed)
Patient ID: [name], female   DOB: Feb 14, 1932, 85 y.o.   MRN: [id number]  PROGRESS NOTE    KEVON ARMAN  FOY:[id number] DOB: 03-17-1932 DOA: 11/30/2018 PCP: Patient, No Pcp Per   Brief Narrative:  85 year old female with history of recent GI bleeding for which she was admitted on 11/16/2018 and discharged on 11/19/2018 for which she underwent EGD and colonoscopy which showed couple of polyps in the large bowel and a small GE junction mucosal tear which was nonbleeding; essential hypertension, bioprosthetic aortic valve replacement over 10 years ago, dyslipidemia, CAD status post CABG, hypothyroidism presented on 11/30/2018 with rectal bleeding.  Hemoglobin was found to be 7.3, was 9.3 on recent discharge.  GI was consulted.  CT angiogram of the abdomen showed no evidence of acute bleeding and no other acute findings.  Patient received 2 units of packed red cell transfusion.  Assessment & Plan:   Principal Problem:   GI bleed Active Problems:   Essential hypertension   Anemia, blood loss   History of aortic valve replacement with bioprosthetic valve   Hypotension   AKI (acute kidney injury) (HCC)   Hypothyroidism  Probable lower GI bleeding causing symptomatic anemia -Patient had recent EGD and colonoscopy which showed couple of polyps in the large bowel and a small GE junction mucosal tear which was nonbleeding -Presented again with rectal bleeding.  Hemoglobin was 7.3 on presentation; was 9.3 on recent discharge -Status post 2 units packed red cell transfusion.  Hemoglobin 8.8 this morning.  No bleeding since admission. -Dr. Loreta Ave was consulted on admission by ED provider.  Will follow GI recommendations. -Currently on Protonix drip: Not sure if the patient needs Protonix drip but this is a lower GI bleeding most likely. -Plavix on hold.  Patient is n.p.o.  Essential hypertension -Antihypertensives on hold.  Monitor blood pressure  CAD with history of CABG -No chest pain.  Plavix on  hold.  Resume Coreg and statin once started on a diet.  Dyslipidemia -Resume statin once taking orally  History of bioprosthetic aortic valve replacement over 10 years ago at [facility] -Stable.  Outpatient follow-up   DVT prophylaxis: SCDs Code Status: Full Family Communication: None at bedside Disposition Plan: Home in 1 to 2 days once rectal bleeding stops and once cleared by GI  Consultants: Gastroenterology/Dr. Loreta Ave notified by ED provider on admission  Procedures: None  Antimicrobials: None   Subjective: Patient seen and examined at bedside.  She states that she has not had any more rectal bleeding since admission.  Feels slightly better.  Denies any abdominal pain.  No overnight fever or vomiting.  Objective: Vitals:   12/01/18 0123 12/01/18 0452 12/01/18 0737 12/01/18 0823  BP: (!) 116/55 (!) 123/54    Pulse: 64 (!) 59    Resp: 18 19    Temp: 98.2 F (36.8 C) 98.6 F (37 C) 97.7 F (36.5 C) 98.8 F (37.1 C)  TempSrc: Oral Oral Axillary Oral  SpO2: 100% 99%    Weight:      Height:        Intake/Output Summary (Last 24 hours) at 12/01/2018 0956 Last data filed at 12/01/2018 0700 Gross per 24 hour  Intake 1745.17 ml  Output -  Net 1745.17 ml   Filed Weights   11/30/18 1927  Weight: 45 kg    Examination:  General exam: Appears calm and comfortable.  Thinly built.  No distress Respiratory system: Bilateral decreased breath sounds at bases Cardiovascular system: S1 & S2 heard,  mild bradycardia Gastrointestinal system: Abdomen is nondistended, soft and nontender. Normal bowel sounds heard. Extremities: No cyanosis, clubbing, edema    Data Reviewed: I have personally reviewed following labs and imaging studies  CBC: Recent Labs  Lab 11/30/18 1937 12/01/18 0723  WBC 6.4 6.1  HGB 7.3* 8.8*  HCT 23.6* 27.1*  MCV 95.5 90.3  PLT 173 116*   Basic Metabolic Panel: Recent Labs  Lab 11/30/18 1937 12/01/18 0723  NA 139 141  K 4.8 4.1  CL 109 113*   CO2 22 21*  GLUCOSE 164* 77  BUN 25* 18  CREATININE 1.20* 1.14*  CALCIUM 8.2* 8.2*   GFR: Estimated Creatinine Clearance: 25.6 mL/min (A) (by C-G formula based on SCr of 1.14 mg/dL (H)). Liver Function Tests: Recent Labs  Lab 11/30/18 1937 12/01/18 0723  AST 17 17  ALT 11 10  ALKPHOS 45 32*  BILITOT 0.4 1.3*  PROT 4.7* 4.6*  ALBUMIN 2.8* 2.6*   No results for input(s): LIPASE, AMYLASE in the last 168 hours. No results for input(s): AMMONIA in the last 168 hours. Coagulation Profile: No results for input(s): INR, PROTIME in the last 168 hours. Cardiac Enzymes: No results for input(s): CKTOTAL, CKMB, CKMBINDEX, TROPONINI in the last 168 hours. BNP (last 3 results) No results for input(s): PROBNP in the last 8760 hours. HbA1C: No results for input(s): HGBA1C in the last 72 hours. CBG: No results for input(s): GLUCAP in the last 168 hours. Lipid Profile: No results for input(s): CHOL, HDL, LDLCALC, TRIG, CHOLHDL, LDLDIRECT in the last 72 hours. Thyroid Function Tests: No results for input(s): TSH, T4TOTAL, FREET4, T3FREE, THYROIDAB in the last 72 hours. Anemia Panel: No results for input(s): VITAMINB12, FOLATE, FERRITIN, TIBC, IRON, RETICCTPCT in the last 72 hours. Sepsis Labs: No results for input(s): PROCALCITON, LATICACIDVEN in the last 168 hours.  No results found for this or any previous visit (from the past 240 hour(s)).       Radiology Studies: Ct Angio Abd/pel W And/or Wo Contrast  Result Date: 11/30/2018 CLINICAL DATA:  Bright red blood in stools EXAM: CT ANGIOGRAPHY ABDOMEN AND PELVIS TECHNIQUE: Multidetector CT imaging of the abdomen and pelvis was performed using the standard protocol during bolus administration of intravenous contrast. Multiplanar reconstructed images and MIPs were obtained and reviewed to evaluate the vascular anatomy. CONTRAST:  80mL OMNIPAQUE IOHEXOL 350 MG/ML SOLN COMPARISON:  11/17/2018 FINDINGS: VASCULAR Aorta: The abdominal aorta  demonstrates atherosclerotic calcifications without aneurysmal dilatation or findings of dissection. Celiac: Mild atherosclerotic changes are noted at the origin although the celiac axis is widely patent. SMA: Mild atherosclerotic changes are noted at the origin. No focal stenosis is noted. No filling defects are seen. Renals: Atherosclerotic changes are noted at the origins bilaterally although no focal in the abdomen is significant stenosis is seen. IMA: Patent without evidence of aneurysm, dissection, vasculitis or significant stenosis. Iliacs: Mild atherosclerotic changes are noted. No aneurysmal dilatation or stenosis is seen. Veins: No specific venous anomaly is noted. Review of the MIP images confirms the above findings. NON-VASCULAR Lower chest: Lung bases are well aerated without focal infiltrate or sizable effusion. Chronic nodularity is noted in the bases bilaterally. Hepatobiliary: No focal liver abnormality is seen. No gallstones, gallbladder wall thickening, or biliary dilatation. Pancreas: Unremarkable. No pancreatic ductal dilatation or surrounding inflammatory changes. Spleen: Multiple calcified granulomas are noted. Adrenals/Urinary Tract: Adrenal glands are within normal limits. Kidneys are well visualized bilaterally. No renal calculi or obstructive changes are noted. The bladder is partially decompressed. Stomach/Bowel:  No obstructive or inflammatory changes of large or small bowel are seen. No areas of contrast pooling are identified to suggest active extravasation. By history, patient has known internal hemorrhoids. These are not well appreciated on this exam. No gastric abnormality is noted. Lymphatic: No lymphadenopathy is seen. Reproductive: Uterus and bilateral adnexa are unremarkable. Other: No abdominal wall hernia or abnormality. No abdominopelvic ascites. Musculoskeletal: Degenerative changes of lumbar spine are noted. IMPRESSION: VASCULAR Atherosclerotic changes without aneurysmal  dilatation or dissection. No findings to suggest active extravasation/arterial hemorrhage is seen. By history the patient has internal hemorrhoids which may account for the bright red blood per rectum. NON-VASCULAR Chronic changes without acute abnormality as described above. Electronically Signed   By: Alcide Clever M.D.   On: 11/30/2018 22:36        Scheduled Meds: . sodium chloride   Intravenous Once  . [START ON 12/04/2018] pantoprazole  40 mg Intravenous Q12H   Continuous Infusions: . sodium chloride 100 mL/hr at 12/01/18 0500  . pantoprozole (PROTONIX) infusion 8 mg/hr (12/01/18 0500)     LOS: 1 day        Glade Lloyd, MD [facility] 12/01/2018, 9:56 AM

## 2018-12-02 DIAGNOSIS — E039 Hypothyroidism, unspecified: Secondary | ICD-10-CM

## 2018-12-02 LAB — CBC WITH DIFFERENTIAL/PLATELET
Abs Immature Granulocytes: 0.01 10*3/uL (ref 0.00–0.07)
Basophils Absolute: 0 10*3/uL (ref 0.0–0.1)
Basophils Relative: 1 %
Eosinophils Absolute: 0.3 10*3/uL (ref 0.0–0.5)
Eosinophils Relative: 5 %
HCT: 26.8 % — ABNORMAL LOW (ref 36.0–46.0)
Hemoglobin: 8.3 g/dL — ABNORMAL LOW (ref 12.0–15.0)
Immature Granulocytes: 0 %
Lymphocytes Relative: 34 %
Lymphs Abs: 1.6 10*3/uL (ref 0.7–4.0)
MCH: 28.2 pg (ref 26.0–34.0)
MCHC: 31 g/dL (ref 30.0–36.0)
MCV: 91.2 fL (ref 80.0–100.0)
Monocytes Absolute: 0.5 10*3/uL (ref 0.1–1.0)
Monocytes Relative: 11 %
Neutro Abs: 2.3 10*3/uL (ref 1.7–7.7)
Neutrophils Relative %: 49 %
Platelets: 112 10*3/uL — ABNORMAL LOW (ref 150–400)
RBC: 2.94 MIL/uL — ABNORMAL LOW (ref 3.87–5.11)
RDW: 15.9 % — ABNORMAL HIGH (ref 11.5–15.5)
WBC: 4.7 10*3/uL (ref 4.0–10.5)
nRBC: 0 % (ref 0.0–0.2)

## 2018-12-02 LAB — BPAM RBC
Blood Product Expiration Date: 202004292359
Blood Product Expiration Date: 202004292359
ISSUE DATE / TIME: 202004132121
ISSUE DATE / TIME: 202004140129
Unit Type and Rh: 5100
Unit Type and Rh: 5100

## 2018-12-02 LAB — BASIC METABOLIC PANEL
Anion gap: 7 (ref 5–15)
BUN: 9 mg/dL (ref 8–23)
CO2: 23 mmol/L (ref 22–32)
Calcium: 8.5 mg/dL — ABNORMAL LOW (ref 8.9–10.3)
Chloride: 112 mmol/L — ABNORMAL HIGH (ref 98–111)
Creatinine, Ser: 1.15 mg/dL — ABNORMAL HIGH (ref 0.44–1.00)
GFR calc Af Amer: 50 mL/min — ABNORMAL LOW (ref >60)
GFR calc non Af Amer: 43 mL/min — ABNORMAL LOW (ref >60)
Glucose, Bld: 83 mg/dL (ref 70–99)
Potassium: 3.8 mmol/L (ref 3.5–5.1)
Sodium: 142 mmol/L (ref 135–145)

## 2018-12-02 LAB — TYPE AND SCREEN
ABO/RH(D): O POS
Antibody Screen: NEGATIVE
Unit division: 0
Unit division: 0

## 2018-12-02 LAB — MAGNESIUM: Magnesium: 1.6 mg/dL — ABNORMAL LOW (ref 1.7–2.4)

## 2018-12-02 MED ORDER — LEVOTHYROXINE SODIUM 50 MCG PO TABS
50.0000 ug | ORAL_TABLET | Freq: Every day | ORAL | Status: DC
  Administered 2018-12-03: 06:00:00 50 ug via ORAL
  Filled 2018-12-02: qty 1

## 2018-12-02 MED ORDER — AMLODIPINE BESYLATE 2.5 MG PO TABS: 2.5000 mg | ORAL_TABLET | Freq: Every day | ORAL | Status: DC

## 2018-12-02 MED ORDER — MAGNESIUM SULFATE 2 GM/50ML IV SOLN
2.0000 g | Freq: Once | INTRAVENOUS | Status: AC
  Administered 2018-12-02: 2 g via INTRAVENOUS
  Filled 2018-12-02: qty 50

## 2018-12-02 MED ORDER — CARVEDILOL 3.125 MG PO TABS: 3.1250 mg | ORAL_TABLET | Freq: Two times a day (BID) | ORAL | Status: DC

## 2018-12-02 MED ORDER — ATORVASTATIN CALCIUM 10 MG PO TABS
20.0000 mg | ORAL_TABLET | Freq: Every day | ORAL | Status: DC
  Administered 2018-12-02: 20 mg via ORAL
  Filled 2018-12-02: qty 2

## 2018-12-02 NOTE — Progress Notes (Addendum)
Patient Demographics:    [name], is a 85 y.o. female, DOB - [birth year], ZOX:[id number]  Admit date - 11/30/2018   Admitting Physician Rometta Emery, MD  Outpatient Primary MD for the patient is Patient, No Pcp Per  LOS - 2   No chief complaint on file.       Subjective:    [name] today has no fevers, no emesis,  No chest pain,  No BM, tolerating clear liquids  Assessment  & Plan :    Principal Problem:   GI bleed Active Problems:   Essential hypertension   Anemia, blood loss   History of aortic valve replacement with bioprosthetic valve   Hypotension   AKI (acute kidney injury) (HCC)   Hypothyroidism  Brief Summary:- 85 year old female with history of  HTN, HLD, bioprosthetic aortic valve replacement over 10 years ago,,Hypothyroidism, CAD/ S/p CABG, recent GI bleeding for which she was admitted on 11/16/2018 and discharged on 11/19/2018 for which she underwent EGD and colonoscopy on 11/18/2018  which showed couple of polyps in the large bowel and a small GE junction mucosal tear which was nonbleeding (had polypectomy 11/18/2018),  Readmitted on 11/30/2018 with rectal bleeding after restarting Plavix.  Hemoglobin was found to be 7.3, was 9.3 on recent discharge.    Patient was transfused (2 units),  CT angiogram of the abdomen showed no evidence of acute bleeding and no other acute findings.      A/p  1)Acute Blood Loss Anemia due to GI bleed----patient with recent post polypectomy on 11/18/2018 , CT abdomen and pelvis without bleeding source, Hgb is down to 8.3  (was 9.0 post transfusion )  no further BM, patient with dyspnea on exertion and dizziness with activity (symptomatic anemia), Dr. Loreta Ave from GI service recommends against discharge today.... Patient may need further intervention if H&H continues to drop,  Continue to hold Plavix  2)CAD -s/p CABG--  Continue to hold aspirin and Plavix,  dyspnea on exertion and dizziness with activity but likely due to acute blood loss anemia overnight . No Chest pains and no ACS type symptoms, start Coreg 3.125 mg bid, give Lipitor 20 mg daily,   3)HTN--- BP soft, okay to hold amlodipine, hold losartan, restart Coreg for CAD as above #2  Disposition/Need for in-Hospital Stay- patient unable to be discharged at this time due to  patient with dyspnea on exertion and dizziness with activity (symptomatic anemia), Dr. Loreta Ave from GI service recommends against discharge today.... Patient may need further intervention if H&H continues to drop...  Code Status : Full  Family Communication:   na  Disposition Plan  : TBD  Consults  :  Gi  DVT Prophylaxis  :   - SCDs   Lab Results  Component Value Date   PLT 112 (L) 12/02/2018   Inpatient Medications  Scheduled Meds: . sodium chloride   Intravenous Once  . [START ON 12/04/2018] pantoprazole  40 mg Intravenous Q12H   Continuous Infusions: . sodium chloride 75 mL/hr at 12/02/18 0702  . pantoprozole (PROTONIX) infusion 8 mg/hr (12/02/18 1625)   PRN Meds:.ondansetron **OR** ondansetron (ZOFRAN) IV    Anti-infectives (From admission, onward)   None        Objective:   Vitals:  12/01/18 1953 12/02/18 0353 12/02/18 0900 12/02/18 1533  BP: 130/60 (!) 109/47    Pulse: (!) 58 64    Resp: 18 18    Temp: 98.2 F (36.8 C) 98 F (36.7 C) 98.3 F (36.8 C) 98.4 F (36.9 C)  TempSrc: Oral Oral Oral Oral  SpO2: 99% 96%    Weight:      Height:        Wt Readings from Last 3 Encounters:  11/30/18 45 kg  11/18/18 45 kg    Intake/Output Summary (Last 24 hours) at 12/02/2018 1720 Last data filed at 12/02/2018 1629 Gross per 24 hour  Intake 1136.45 ml  Output -  Net 1136.45 ml    Physical Exam Patient is examined daily including today on 12/02/18 , exams remain the same as of yesterday except that has changed   Gen:- Awake Alert,  In no apparent distress  HEENT:- [place].AT, No sclera  icterus Neck-Supple Neck,No JVD,.  Lungs-  CTAB , fair symmetrical air movement CV- S1, S2 normal, regular , CABG scar Abd-  +ve B.Sounds, Abd Soft, No tenderness,    Extremity/Skin:- No  edema, pedal pulses present  Psych-affect is appropriate, oriented x3 Neuro-no new focal deficits, no tremors   Data Review:   Micro Results No results found for this or any previous visit (from the past 240 hour(s)).  Radiology Reports Ct Abdomen Pelvis W Contrast  Result Date: 11/17/2018 CLINICAL DATA:  Abdominal pain, suspected diverticulitis EXAM: CT ABDOMEN AND PELVIS WITH CONTRAST TECHNIQUE: Multidetector CT imaging of the abdomen and pelvis was performed using the standard protocol following bolus administration of intravenous contrast. Sagittal and coronal MPR images reconstructed from axial data set. CONTRAST:  80mL OMNIPAQUE IOHEXOL 300 MG/ML SOLN IV. Dilute oral contrast. COMPARISON:  None FINDINGS: Lower chest: Probable tiny calcified granuloma RIGHT lung base image 10. Hepatobiliary: Gallbladder and liver normal appearance Pancreas: Normal appearance Spleen: Calcified granulomata within spleen.  No focal mass lesion. Adrenals/Urinary Tract: Adrenal glands, kidneys, ureters, and mildly distended bladder otherwise normal appearance. Stomach/Bowel: Normal appendix. Stomach and bowel loops normal appearance Vascular/Lymphatic: Atherosclerotic calcifications aorta and iliac arteries without aneurysm. No adenopathy. Reproductive: Atrophic uterus and ovaries Other: No free air free fluid. No hernia or acute inflammatory process. Musculoskeletal: Bones demineralized. IMPRESSION: No acute intra-abdominal or intrapelvic abnormalities. Electronically Signed   By: Ulyses SouthwardMark  Boles M.D.   On: 11/17/2018 12:58   Ct Angio Abd/pel W And/or Wo Contrast  Result Date: 11/30/2018 CLINICAL DATA:  Bright red blood in stools EXAM: CT ANGIOGRAPHY ABDOMEN AND PELVIS TECHNIQUE: Multidetector CT imaging of the abdomen and pelvis  was performed using the standard protocol during bolus administration of intravenous contrast. Multiplanar reconstructed images and MIPs were obtained and reviewed to evaluate the vascular anatomy. CONTRAST:  80mL OMNIPAQUE IOHEXOL 350 MG/ML SOLN COMPARISON:  11/17/2018 FINDINGS: VASCULAR Aorta: The abdominal aorta demonstrates atherosclerotic calcifications without aneurysmal dilatation or findings of dissection. Celiac: Mild atherosclerotic changes are noted at the origin although the celiac axis is widely patent. SMA: Mild atherosclerotic changes are noted at the origin. No focal stenosis is noted. No filling defects are seen. Renals: Atherosclerotic changes are noted at the origins bilaterally although no focal in the abdomen is significant stenosis is seen. IMA: Patent without evidence of aneurysm, dissection, vasculitis or significant stenosis. Iliacs: Mild atherosclerotic changes are noted. No aneurysmal dilatation or stenosis is seen. Veins: No specific venous anomaly is noted. Review of the MIP images confirms the above findings. NON-VASCULAR Lower chest: Lung bases  are well aerated without focal infiltrate or sizable effusion. Chronic nodularity is noted in the bases bilaterally. Hepatobiliary: No focal liver abnormality is seen. No gallstones, gallbladder wall thickening, or biliary dilatation. Pancreas: Unremarkable. No pancreatic ductal dilatation or surrounding inflammatory changes. Spleen: Multiple calcified granulomas are noted. Adrenals/Urinary Tract: Adrenal glands are within normal limits. Kidneys are well visualized bilaterally. No renal calculi or obstructive changes are noted. The bladder is partially decompressed. Stomach/Bowel: No obstructive or inflammatory changes of large or small bowel are seen. No areas of contrast pooling are identified to suggest active extravasation. By history, patient has known internal hemorrhoids. These are not well appreciated on this exam. No gastric abnormality  is noted. Lymphatic: No lymphadenopathy is seen. Reproductive: Uterus and bilateral adnexa are unremarkable. Other: No abdominal wall hernia or abnormality. No abdominopelvic ascites. Musculoskeletal: Degenerative changes of lumbar spine are noted. IMPRESSION: VASCULAR Atherosclerotic changes without aneurysmal dilatation or dissection. No findings to suggest active extravasation/arterial hemorrhage is seen. By history the patient has internal hemorrhoids which may account for the bright red blood per rectum. NON-VASCULAR Chronic changes without acute abnormality as described above. Electronically Signed   By: Alcide Clever M.D.   On: 11/30/2018 22:36     CBC Recent Labs  Lab 11/30/18 1937 12/01/18 0723 12/01/18 1235 12/01/18 1939 12/02/18 0523  WBC 6.4 6.1 6.1 5.5 4.7  HGB 7.3* 8.8* 9.0* 8.4* 8.3*  HCT 23.6* 27.1* 27.8* 26.6* 26.8*  PLT 173 116* 123* 115* 112*  MCV 95.5 90.3 88.3 90.2 91.2  MCH 29.6 29.3 28.6 28.5 28.2  MCHC 30.9 32.5 32.4 31.6 31.0  RDW 15.8* 15.9* 15.9* 16.1* 15.9*  LYMPHSABS  --   --   --   --  1.6  MONOABS  --   --   --   --  0.5  EOSABS  --   --   --   --  0.3  BASOSABS  --   --   --   --  0.0   Chemistries  Recent Labs  Lab 11/30/18 1937 12/01/18 0723 12/02/18 0523  NA 139 141 142  K 4.8 4.1 3.8  CL 109 113* 112*  CO2 22 21* 23  GLUCOSE 164* 77 83  BUN 25* 18 9  CREATININE 1.20* 1.14* 1.15*  CALCIUM 8.2* 8.2* 8.5*  MG  --   --  1.6*  AST 17 17  --   ALT 11 10  --   ALKPHOS 45 32*  --   BILITOT 0.4 1.3*  --    ------------------------------------------------------------------------------------------------------------------ No results for input(s): CHOL, HDL, LDLCALC, TRIG, CHOLHDL, LDLDIRECT in the last 72 hours.  No results found for: HGBA1C ------------------------------------------------------------------------------------------------------------------ No results for input(s): TSH, T4TOTAL, T3FREE, THYROIDAB in the last 72 hours.  Invalid  input(s): FREET3 ------------------------------------------------------------------------------------------------------------------ No results for input(s): VITAMINB12, FOLATE, FERRITIN, TIBC, IRON, RETICCTPCT in the last 72 hours.  Coagulation profile No results for input(s): INR, PROTIME in the last 168 hours.  No results for input(s): DDIMER in the last 72 hours.  Cardiac Enzymes No results for input(s): CKMB, TROPONINI, MYOGLOBIN in the last 168 hours.  Invalid input(s): CK ------------------------------------------------------------------------------------------------------------------ No results found for: BNP  Shon Hale M.D on 12/02/2018 at 5:20 PM  Go to [email or web address] - for contact info  [facility] - Office  [phone]

## 2018-12-02 NOTE — Progress Notes (Signed)
Physical Therapy Treatment and Discharge Patient Details Name: [name] MRN: [id number] DOB: 12/25/1931 Today's Date: 12/02/2018    History of Present Illness Pt is an 85 y/o female who presents with rectal bleeding. Pt recently admitted 3/30-4/2 for GI bleed. PMH significant for MI, HTN, CKD.    PT Comments    Pt progressing well with mobility. Balance and ambulation appear to be baseline (pt in agreement). Therapist managed IV pole throughout session and did not require any assistance or AD. Pt has met acute PT goal and we will sign off at this time. If needs change, please reconsult.     Follow Up Recommendations  No PT follow up;Supervision for mobility/OOB     Equipment Recommendations  None recommended by PT    Recommendations for Other Services       Precautions / Restrictions Precautions Precautions: Fall Restrictions Weight Bearing Restrictions: No    Mobility  Bed Mobility Overal bed mobility: Modified Independent Bed Mobility: Supine to Sit           General bed mobility comments: Pt was able to transition to EOB with HOB slightly elevated; no use of rails required.   Transfers Overall transfer level: Modified independent Equipment used: None Transfers: Sit to/from Stand           General transfer comment: No assist to power-up to full stand. Pt was able to achieve full standing and gain/maintain balance without assist.   Ambulation/Gait Ambulation/Gait assistance: Modified independent (Device/Increase time) Gait Distance (Feet): 450 Feet Assistive device: None Gait Pattern/deviations: Step-through pattern;Decreased stride length;Trunk flexed Gait velocity: Decreased Gait velocity interpretation: 1.31 - 2.62 ft/sec, indicative of limited community ambulator General Gait Details: Pt ambulating well, and reports baseline. Therapist managed IV pole and pt without UE support or AD.    Stairs             Wheelchair Mobility     Modified Rankin (Stroke Patients Only)       Balance Overall balance assessment: Mild deficits observed, not formally tested Sitting-balance support: Feet supported;No upper extremity supported Sitting balance-Leahy Scale: Good     Standing balance support: No upper extremity supported;During functional activity Standing balance-Leahy Scale: Fair                              Cognition Arousal/Alertness: Awake/alert Behavior During Therapy: WFL for tasks assessed/performed Overall Cognitive Status: Within Functional Limits for tasks assessed                                        Exercises      General Comments        Pertinent Vitals/Pain Pain Assessment: No/denies pain    Home Living Family/patient expects to be discharged to:: Private residence Living Arrangements: Alone Available Help at Discharge: Family;Available 24 hours/day Type of Home: House Home Access: Stairs to enter Entrance Stairs-Rails: Right Home Layout: One level Home Equipment: Walker - 2 wheels;Cane - single point      Prior Function Level of Independence: Independent          PT Goals (current goals can now be found in the care plan section) Acute Rehab PT Goals Patient Stated Goal: Return home as soon as possible PT Goal Formulation: With patient Time For Goal Achievement: 12/08/18 Potential to Achieve Goals: Good Progress towards PT goals: Goals   met/education completed, patient discharged from PT    Frequency    Min 3X/week      PT Plan Current plan remains appropriate    Co-evaluation              AM-PAC PT "6 Clicks" Mobility   Outcome Measure  Help needed turning from your back to your side while in a flat bed without using bedrails?: None Help needed moving from lying on your back to sitting on the side of a flat bed without using bedrails?: None Help needed moving to and from a bed to a chair (including a wheelchair)?: None Help  needed standing up from a chair using your arms (e.g., wheelchair or bedside chair)?: None Help needed to walk in hospital room?: A Little Help needed climbing 3-5 steps with a railing? : A Little 6 Click Score: 22    End of Session Equipment Utilized During Treatment: Gait belt Activity Tolerance: Patient tolerated treatment well Patient left: in chair;with call bell/phone within reach;with chair alarm set Nurse Communication: Mobility status PT Visit Diagnosis: Unsteadiness on feet (R26.81)     Time: 1004-1022 PT Time Calculation (min) (ACUTE ONLY): 18 min  Charges:  $Gait Training: 8-22 mins                     Laura Kirkman, PT, DPT Acute Rehabilitation Services Pager: [phone] Office: [phone]    Laura D Kirkman 12/02/2018, 11:05 AM   

## 2018-12-02 NOTE — Progress Notes (Signed)
Subjective: Patient denies having any rectal bleeding today. She has not had a BM today. She had colonic polyps removed during her last hospitalization on 04/01//2020 and has recurrent hematochezia when she was restarted on the Plavix.   Objective: Vital signs in last 24 hours: Temp:  [98 F (36.7 C)-99 F (37.2 C)] 98.3 F (36.8 C) (04/15 0900) Pulse Rate:  [58-64] 64 (04/15 0353) Resp:  [18] 18 (04/15 0353) BP: (109-130)/(47-60) 109/47 (04/15 0353) SpO2:  [96 %-99 %] 96 % (04/15 0353) Last BM Date: 11/30/18  Intake/Output from previous day: 04/14 0701 - 04/15 0700 In: 300 [P.O.:300] Out: -  Intake/Output this shift: No intake/output data recorded.  General appearance: alert, cooperative, appears stated age, fatigued and pale Resp: clear to auscultation bilaterally; there is a midline scar in the chest  Cardio: regular rate and rhythm, S1, S2 normal, no murmur, click, rub or gallop GI: soft, non-tender; bowel sounds normal; no masses,  no organomegaly Extremities: extremities normal, atraumatic, no cyanosis or edema  Lab Results: Recent Labs    12/01/18 1235 12/01/18 1939 12/02/18 0523  WBC 6.1 5.5 4.7  HGB 9.0* 8.4* 8.3*  HCT 27.8* 26.6* 26.8*  PLT 123* 115* 112*   BMET Recent Labs    11/30/18 1937 12/01/18 0723 12/02/18 0523  NA 139 141 142  K 4.8 4.1 3.8  CL 109 113* 112*  CO2 22 21* 23  GLUCOSE 164* 77 83  BUN 25* 18 9  CREATININE 1.20* 1.14* 1.15*  CALCIUM 8.2* 8.2* 8.5*   LFT Recent Labs    12/01/18 0723  PROT 4.6*  ALBUMIN 2.6*  AST 17  ALT 10  ALKPHOS 32*  BILITOT 1.3*   Studies/Results: Ct Angio Abd/pel W And/or Wo Contrast  Result Date: 11/30/2018 CLINICAL DATA:  Bright red blood in stools EXAM: CT ANGIOGRAPHY ABDOMEN AND PELVIS TECHNIQUE: Multidetector CT imaging of the abdomen and pelvis was performed using the standard protocol during bolus administration of intravenous contrast. Multiplanar reconstructed images and MIPs were obtained  and reviewed to evaluate the vascular anatomy. CONTRAST:  7mL OMNIPAQUE IOHEXOL 350 MG/ML SOLN COMPARISON:  11/17/2018 FINDINGS: VASCULAR Aorta: The abdominal aorta demonstrates atherosclerotic calcifications without aneurysmal dilatation or findings of dissection. Celiac: Mild atherosclerotic changes are noted at the origin although the celiac axis is widely patent. SMA: Mild atherosclerotic changes are noted at the origin. No focal stenosis is noted. No filling defects are seen. Renals: Atherosclerotic changes are noted at the origins bilaterally although no focal in the abdomen is significant stenosis is seen. IMA: Patent without evidence of aneurysm, dissection, vasculitis or significant stenosis. Iliacs: Mild atherosclerotic changes are noted. No aneurysmal dilatation or stenosis is seen. Veins: No specific venous anomaly is noted. Review of the MIP images confirms the above findings. NON-VASCULAR Lower chest: Lung bases are well aerated without focal infiltrate or sizable effusion. Chronic nodularity is noted in the bases bilaterally. Hepatobiliary: No focal liver abnormality is seen. No gallstones, gallbladder wall thickening, or biliary dilatation. Pancreas: Unremarkable. No pancreatic ductal dilatation or surrounding inflammatory changes. Spleen: Multiple calcified granulomas are noted. Adrenals/Urinary Tract: Adrenal glands are within normal limits. Kidneys are well visualized bilaterally. No renal calculi or obstructive changes are noted. The bladder is partially decompressed. Stomach/Bowel: No obstructive or inflammatory changes of large or small bowel are seen. No areas of contrast pooling are identified to suggest active extravasation. By history, patient has known internal hemorrhoids. These are not well appreciated on this exam. No gastric abnormality is noted. Lymphatic: No  lymphadenopathy is seen. Reproductive: Uterus and bilateral adnexa are unremarkable. Other: No abdominal wall hernia or  abnormality. No abdominopelvic ascites. Musculoskeletal: Degenerative changes of lumbar spine are noted. IMPRESSION: VASCULAR Atherosclerotic changes without aneurysmal dilatation or dissection. No findings to suggest active extravasation/arterial hemorrhage is seen. By history the patient has internal hemorrhoids which may account for the bright red blood per rectum. NON-VASCULAR Chronic changes without acute abnormality as described above. Electronically Signed   By: Alcide CleverMark  Lukens M.D.   On: 11/30/2018 22:36   Medications: I have reviewed the patient's current medications.  Assessment/Plan: Rectal bleeding/Negative CTA-?Post-polypectomy bleed after starting Plavix/Internal hemorrhoids/Post-hemorrhagic anemia. Currently stable. Will need to observe her for another day and discuss the use of anticoagulants with her cardiologist prior to discharge  LOS: 2 days   Javon Snee 12/02/2018, 9:52 AM

## 2018-12-03 LAB — CBC
HCT: 28.7 % — ABNORMAL LOW (ref 36.0–46.0)
Hemoglobin: 9.1 g/dL — ABNORMAL LOW (ref 12.0–15.0)
MCH: 29.1 pg (ref 26.0–34.0)
MCHC: 31.7 g/dL (ref 30.0–36.0)
MCV: 91.7 fL (ref 80.0–100.0)
Platelets: 125 10*3/uL — ABNORMAL LOW (ref 150–400)
RBC: 3.13 MIL/uL — ABNORMAL LOW (ref 3.87–5.11)
RDW: 15.5 % (ref 11.5–15.5)
WBC: 5.4 10*3/uL (ref 4.0–10.5)
nRBC: 0 % (ref 0.0–0.2)

## 2018-12-03 LAB — BASIC METABOLIC PANEL
Anion gap: 9 (ref 5–15)
BUN: 8 mg/dL (ref 8–23)
CO2: 23 mmol/L (ref 22–32)
Calcium: 8.7 mg/dL — ABNORMAL LOW (ref 8.9–10.3)
Chloride: 109 mmol/L (ref 98–111)
Creatinine, Ser: 1.04 mg/dL — ABNORMAL HIGH (ref 0.44–1.00)
GFR calc Af Amer: 57 mL/min — ABNORMAL LOW (ref >60)
GFR calc non Af Amer: 49 mL/min — ABNORMAL LOW (ref >60)
Glucose, Bld: 83 mg/dL (ref 70–99)
Potassium: 3.2 mmol/L — ABNORMAL LOW (ref 3.5–5.1)
Sodium: 141 mmol/L (ref 135–145)

## 2018-12-03 MED ORDER — AMLODIPINE BESYLATE 5 MG PO TABS: 5.0000 mg | ORAL_TABLET | Freq: Every day | ORAL | 2 refills | Status: AC

## 2018-12-03 MED ORDER — POTASSIUM CHLORIDE CRYS ER 20 MEQ PO TBCR
40.0000 meq | EXTENDED_RELEASE_TABLET | Freq: Once | ORAL | Status: AC
  Administered 2018-12-03: 11:00:00 40 meq via ORAL
  Filled 2018-12-03: qty 2

## 2018-12-03 MED ORDER — ASPIRIN EC 81 MG PO TBEC: 81.0000 mg | DELAYED_RELEASE_TABLET | Freq: Every day | ORAL | 2 refills | Status: AC

## 2018-12-03 MED ORDER — FERROUS SULFATE 325 (65 FE) MG PO TABS: 325.0000 mg | ORAL_TABLET | Freq: Every day | ORAL | 0 refills | Status: AC

## 2018-12-03 MED ORDER — ONDANSETRON HCL 4 MG PO TABS: 4.0000 mg | ORAL_TABLET | Freq: Four times a day (QID) | ORAL | 0 refills | Status: AC | PRN

## 2018-12-03 NOTE — Discharge Instructions (Signed)
1)Okay to take Aspirin 81 mg daily with food- 2)Hold Plavix/Clopidogrel--- until you see your cardiologist in 1  Week to decide when to restart Plavix 3)Avoid ibuprofen/Advil/Aleve/Motrin/Goody Powders/Naproxen/BC powders/Meloxicam/Diclofenac/Indomethacin and other Nonsteroidal anti-inflammatory medications as these will make you more likely to bleed and can cause stomach ulcers, can also cause Kidney problems.  4) call or return if chest pains, palpitations, dizziness, or if you notice any dark stools or blood in your stool 5)Repeat CBC /complete blood count Test within a week

## 2018-12-03 NOTE — Progress Notes (Signed)
[name] to be D/C'd Home per MD order.  Discussed with the patient and all questions fully answered.  VSS, Skin clean, dry and intact without evidence of skin break down, no evidence of skin tears noted. IV catheter discontinued intact. Site without signs and symptoms of complications. Dressing and pressure applied.  An After Visit Summary was printed and given to the patient. Patient received prescription.  D/c education completed with patient/family including follow up instructions, medication list, d/c activities limitations if indicated, with other d/c instructions as indicated by MD - patient able to verbalize understanding, all questions fully answered.   Patient instructed to return to ED, call 911, or call MD for any changes in condition.   Patient escorted via WC, and D/C home via private auto.  Eligah East 12/03/2018 5:23 PM

## 2018-12-03 NOTE — Discharge Summary (Signed)
[name], is a 85 y.o. female  DOB [birth year]  MRN [id number].  Admission date:  11/30/2018  Admitting Physician  Rometta Emery, MD  Discharge Date:  12/03/2018   Primary MD  Patient, No Pcp Per  Recommendations for primary care physician for things to follow:   1)Okay to take Aspirin 81 mg daily with food- 2)Hold Plavix/Clopidogrel--- until you see your cardiologist in 1  Week to decide when to restart Plavix 3)Avoid ibuprofen/Advil/Aleve/Motrin/Goody Powders/Naproxen/BC powders/Meloxicam/Diclofenac/Indomethacin and other Nonsteroidal anti-inflammatory medications as these will make you more likely to bleed and can cause stomach ulcers, can also cause Kidney problems.  4) call or return if chest pains, palpitations, dizziness, or if you notice any dark stools or blood in your stool 5)Repeat CBC /complete blood count Test within a week   Admission Diagnosis  BRBPR (bright red blood per rectum) [K62.5]   Discharge Diagnosis  BRBPR (bright red blood per rectum) [K62.5]    Principal Problem:   GI bleed Active Problems:   Essential hypertension   Anemia, blood loss   History of aortic valve replacement with bioprosthetic valve   Hypotension   AKI (acute kidney injury) (HCC)   Hypothyroidism      Past Medical History:  Diagnosis Date   Anemia    Arthritis    Chronic kidney disease    Complication of anesthesia    cardiac arrest during neck operation    GERD (gastroesophageal reflux disease)    Hypertension    Hypothyroidism    Myocardial infarction [facility])     Past Surgical History:  Procedure Laterality Date   BACK SURGERY     CARDIAC CATHETERIZATION     CARDIAC SURGERY     CARDIAC VALVE REPLACEMENT     CERVICAL DISCECTOMY     COLONOSCOPY WITH PROPOFOL N/A 11/18/2018   Procedure: COLONOSCOPY WITH PROPOFOL;  Surgeon: Charna Elizabeth, MD;  Location: [facility] ENDOSCOPY;  Service:  Endoscopy;  Laterality: N/A;   ENTEROSCOPY N/A 11/18/2018   Procedure: ENTEROSCOPY;  Surgeon: Charna Elizabeth, MD;  Location: [facility] ENDOSCOPY;  Service: Endoscopy;  Laterality: N/A;   POLYPECTOMY  11/18/2018   Procedure: POLYPECTOMY;  Surgeon: Charna Elizabeth, MD;  Location: [facility] ENDOSCOPY;  Service: Endoscopy;;   SCLEROTHERAPY  11/18/2018   Procedure: Susa Day;  Surgeon: Charna Elizabeth, MD;  Location: [facility] ENDOSCOPY;  Service: Endoscopy;;       HPI  from the history and physical done on the day of admission:   Outpatient Specialists: Dr. Loreta Ave gastroenterology  Patient coming from: Home  Chief Complaint: Rectal bleed  HPI: [name] is a 85 y.o. female with medical history significant of recent GI bleed on November 16, 2018 for which patient underwent EGD and colonoscopy with no obvious source found, hypothyroidism, hypertension, coronary artery disease, hyperlipidemia, who presented to the ER today with another episode of rectal bleeding.  She has had a few episodes at home and had 1 large bloody bowel movement in the ER.  Patient's hemoglobin was 9.3 on the second of this  month when she left the hospital.  It is down to 7.3 today.  Denied any fever or chills.  Denied any abdominal pain.  No nausea vomiting or diarrhea.  Patient is therefore being admitted for GI bleed with symptomatic anemia due to dizziness and hypotension..  ED Course: Temperature is 97 for blood pressure 81/40 pulse 87 respiratory rate of 20 oxygen sat 97% room air.  White count 6.4 hemoglobin 7.3 and platelets 173.  BUN is 25 and creatinine 1.20 calcium 8.2 glucose 164.  CT angiogram of the abdomen shows no evidence of acute bleed and no other significant findings.  ER has contacted patient's gastroenterologist and recommendations to admit and they will follow.  Blood transfusion initiated in the ER.    Hospital Course:      Echo from 04/14/2018 Summary Normal left ventricular size and systolic function with no  appreciable segmental abnormality. Ejection fraction is visually estimated at 60-65%. Diastolic function appears normal. Normally functioning bioprosthetic valve in the mitral position. Mild mitral regurgitation.   Brief Summary:- 85 year old female with history of  HTN, HLD, bioprosthetic aortic valve replacement over 10 years ago,,Hypothyroidism, CAD/ S/p CABG, recent GI bleeding for which she was admitted on 11/16/2018 and discharged on 11/19/2018 for which she underwent EGD and colonoscopy on 11/18/2018  which showed couple of polyps in the large bowel and a small GE junction mucosal tear which was nonbleeding (had polypectomy 11/18/2018),  Readmitted on 11/30/2018 with rectal bleeding after restarting Plavix. Hemoglobin was found to be 7.3, was 9.3 on recent discharge.   Patient was transfused (2 units), CT angiogram of the abdomen showed no evidence of acute bleeding and no other acute findings.     A/p  1)Acute Blood Loss Anemia due to GI bleed----patient with recent post polypectomy on 11/18/2018 , CT abdomen and pelvis without bleeding source, Hgb is up to 9.1 from 7.3  post transfusion,   no further BM,  no dizziness, no palpitations, no dyspnea on exertion,,  according to GI service okay to discharge home, hold Plavix for 1 week, patient is strongly advised to discuss indication for Plavix with her cardiologist in [facility] (Dr Judithe Modest) to see if Plavix is still indicated prior to restarting Plavix next week.  May restart aspirin 81 mg with food on 12/04/2018  2)CAD -s/p CABG/CAD in native artery--mild nonobstructive by cath 06/2017 --  had Type 2 NSTEMI November 2018 in the setting of pneumonia/respiratory distress at that time--- stable at this time without ACS symptoms.  restart aspirin as above #1, hold Plavix as above in #1, no chest pains no palpitations no dizziness no dyspnea on exertion  continue Coreg 3.125 mg bid, c/n Lipitor 20 mg daily,   3)HTN--- BP improving okay to restart  amlodipine, losartan, r Coreg for CAD as above #2  4)H/o Mitral valve Repair/H/O mitral valve replacement--2006 with tissue valve after failed mitral valve repair - Her mitral valve replacement seem to be functioning normally by her last echo.03/2018---  5)Bilateral carotid artery stenosis--- last carotid artery Dopplers without hemodynamically significant stenosis , aspirin and Plavix as above #1 , continue Lipitor  Code Status : Full  Family Communication:   na  Disposition Plan  : TBD  Consults  :  Gi  DVT Prophylaxis  :   - SCDs    Discharge Condition: stable  Follow UP--- cardiologist Dr. Judithe Modest as advised     Diet and Activity recommendation:  As advised  Discharge Instructions    Discharge Instructions  Call MD for:  difficulty breathing, headache or visual disturbances   Complete by:  As directed    Call MD for:  persistant dizziness or light-headedness   Complete by:  As directed    Call MD for:  persistant nausea and vomiting   Complete by:  As directed    Call MD for:  severe uncontrolled pain   Complete by:  As directed    Call MD for:  temperature >100.4   Complete by:  As directed    Diet - low sodium heart healthy   Complete by:  As directed    Discharge instructions   Complete by:  As directed    1)Okay to take Aspirin 81 mg daily with food- 2)Hold Plavix/Clopidogrel--- until you see your cardiologist in 1  Week to decide when to restart Plavix 3)Avoid ibuprofen/Advil/Aleve/Motrin/Goody Powders/Naproxen/BC powders/Meloxicam/Diclofenac/Indomethacin and other Nonsteroidal anti-inflammatory medications as these will make you more likely to bleed and can cause stomach ulcers, can also cause Kidney problems.  4) call or return if chest pains, palpitations, dizziness, or if you notice any dark stools or blood in your stool 5)Repeat CBC /complete blood count Test within a week   Increase activity slowly   Complete by:  As directed         Discharge  Medications     Allergies as of 12/03/2018   No Known Allergies     Medication List    STOP taking these medications   clopidogrel 75 MG tablet Commonly known as:  PLAVIX     TAKE these medications   amLODipine 5 MG tablet Commonly known as:  NORVASC Take 1 tablet (5 mg total) by mouth daily. What changed:    medication strength  how much to take  when to take this   aspirin EC 81 MG tablet Take 1 tablet (81 mg total) by mouth daily with breakfast. What changed:  when to take this   carvedilol 3.125 MG tablet Commonly known as:  COREG Take 3.125 mg by mouth 2 (two) times daily.   docusate sodium 100 MG capsule Commonly known as:  Colace Take 1 capsule (100 mg total) by mouth daily for 30 days.   ferrous sulfate 325 (65 FE) MG tablet Take 1 tablet (325 mg total) by mouth daily with breakfast. Restart on 12/11/18 if no further Bleeding What changed:  additional instructions   levothyroxine 50 MCG tablet Commonly known as:  SYNTHROID Take 50 mcg by mouth at bedtime.   losartan 100 MG tablet Commonly known as:  COZAAR Take 100 mg by mouth at bedtime.   nitroGLYCERIN 0.4 MG SL tablet Commonly known as:  NITROSTAT Place 0.4 mg under the tongue every 5 (five) minutes as needed for chest pain.   ondansetron 4 MG tablet Commonly known as:  ZOFRAN Take 1 tablet (4 mg total) by mouth every 6 (six) hours as needed for nausea.   pantoprazole 40 MG tablet Commonly known as:  Protonix Take 1 tablet (40 mg total) by mouth 2 (two) times daily.   Pitavastatin Calcium 2 MG Tabs Take 2 mg by mouth at bedtime.       Major procedures and Radiology Reports - PLEASE review detailed and final reports for all details, in brief -   Ct Abdomen Pelvis W Contrast  Result Date: 11/17/2018 CLINICAL DATA:  Abdominal pain, suspected diverticulitis EXAM: CT ABDOMEN AND PELVIS WITH CONTRAST TECHNIQUE: Multidetector CT imaging of the abdomen and pelvis was performed using the  standard protocol following  bolus administration of intravenous contrast. Sagittal and coronal MPR images reconstructed from axial data set. CONTRAST:  80mL OMNIPAQUE IOHEXOL 300 MG/ML SOLN IV. Dilute oral contrast. COMPARISON:  None FINDINGS: Lower chest: Probable tiny calcified granuloma RIGHT lung base image 10. Hepatobiliary: Gallbladder and liver normal appearance Pancreas: Normal appearance Spleen: Calcified granulomata within spleen.  No focal mass lesion. Adrenals/Urinary Tract: Adrenal glands, kidneys, ureters, and mildly distended bladder otherwise normal appearance. Stomach/Bowel: Normal appendix. Stomach and bowel loops normal appearance Vascular/Lymphatic: Atherosclerotic calcifications aorta and iliac arteries without aneurysm. No adenopathy. Reproductive: Atrophic uterus and ovaries Other: No free air free fluid. No hernia or acute inflammatory process. Musculoskeletal: Bones demineralized. IMPRESSION: No acute intra-abdominal or intrapelvic abnormalities. Electronically Signed   By: Ulyses Southward M.D.   On: 11/17/2018 12:58   Ct Angio Abd/pel W And/or Wo Contrast  Result Date: 11/30/2018 CLINICAL DATA:  Bright red blood in stools EXAM: CT ANGIOGRAPHY ABDOMEN AND PELVIS TECHNIQUE: Multidetector CT imaging of the abdomen and pelvis was performed using the standard protocol during bolus administration of intravenous contrast. Multiplanar reconstructed images and MIPs were obtained and reviewed to evaluate the vascular anatomy. CONTRAST:  80mL OMNIPAQUE IOHEXOL 350 MG/ML SOLN COMPARISON:  11/17/2018 FINDINGS: VASCULAR Aorta: The abdominal aorta demonstrates atherosclerotic calcifications without aneurysmal dilatation or findings of dissection. Celiac: Mild atherosclerotic changes are noted at the origin although the celiac axis is widely patent. SMA: Mild atherosclerotic changes are noted at the origin. No focal stenosis is noted. No filling defects are seen. Renals: Atherosclerotic changes are noted  at the origins bilaterally although no focal in the abdomen is significant stenosis is seen. IMA: Patent without evidence of aneurysm, dissection, vasculitis or significant stenosis. Iliacs: Mild atherosclerotic changes are noted. No aneurysmal dilatation or stenosis is seen. Veins: No specific venous anomaly is noted. Review of the MIP images confirms the above findings. NON-VASCULAR Lower chest: Lung bases are well aerated without focal infiltrate or sizable effusion. Chronic nodularity is noted in the bases bilaterally. Hepatobiliary: No focal liver abnormality is seen. No gallstones, gallbladder wall thickening, or biliary dilatation. Pancreas: Unremarkable. No pancreatic ductal dilatation or surrounding inflammatory changes. Spleen: Multiple calcified granulomas are noted. Adrenals/Urinary Tract: Adrenal glands are within normal limits. Kidneys are well visualized bilaterally. No renal calculi or obstructive changes are noted. The bladder is partially decompressed. Stomach/Bowel: No obstructive or inflammatory changes of large or small bowel are seen. No areas of contrast pooling are identified to suggest active extravasation. By history, patient has known internal hemorrhoids. These are not well appreciated on this exam. No gastric abnormality is noted. Lymphatic: No lymphadenopathy is seen. Reproductive: Uterus and bilateral adnexa are unremarkable. Other: No abdominal wall hernia or abnormality. No abdominopelvic ascites. Musculoskeletal: Degenerative changes of lumbar spine are noted. IMPRESSION: VASCULAR Atherosclerotic changes without aneurysmal dilatation or dissection. No findings to suggest active extravasation/arterial hemorrhage is seen. By history the patient has internal hemorrhoids which may account for the bright red blood per rectum. NON-VASCULAR Chronic changes without acute abnormality as described above. Electronically Signed   By: Alcide Clever M.D.   On: 11/30/2018 22:36    Today    Subjective    [name] today has no new concerns, seen and drinking well, no BM, no shortness of breath, no chest pains, no palpitations, no dizziness, no dyspnea on exertion,          Patient has been seen and examined prior to discharge   Objective   Blood pressure 133/64, pulse (!) 52, temperature  99 F (37.2 C), temperature source Oral, resp. rate (!) 21, height  (1.6 m), weight 45 kg, SpO2 100 %.   Intake/Output Summary (Last 24 hours) at 12/03/2018 1557 Last data filed at 12/03/2018 1500 Gross per 24 hour  Intake 2248.53 ml  Output --  Net 2248.53 ml    Exam Gen:- Awake Alert,  In no apparent distress  HEENT:- [place].AT, No sclera icterus Neck-Supple Neck,No JVD,.  Lungs-  CTAB , fair symmetrical air movement CV- S1, S2 normal, regular , CABG scar Abd-  +ve B.Sounds, Abd Soft, No tenderness,    Extremity/Skin:- No  edema, pedal pulses present  Psych-affect is appropriate, oriented x3 Neuro-no new focal deficits, no tremors   Data Review   CBC w Diff:  Lab Results  Component Value Date   WBC 5.4 12/03/2018   HGB 9.1 (L) 12/03/2018   HCT 28.7 (L) 12/03/2018   PLT 125 (L) 12/03/2018   LYMPHOPCT 34 12/02/2018   MONOPCT 11 12/02/2018   EOSPCT 5 12/02/2018   BASOPCT 1 12/02/2018    CMP:  Lab Results  Component Value Date   NA 141 12/03/2018   K 3.2 (L) 12/03/2018   CL 109 12/03/2018   CO2 23 12/03/2018   BUN 8 12/03/2018   CREATININE 1.04 (H) 12/03/2018   PROT 4.6 (L) 12/01/2018   ALBUMIN 2.6 (L) 12/01/2018   BILITOT 1.3 (H) 12/01/2018   ALKPHOS 32 (L) 12/01/2018   AST 17 12/01/2018   ALT 10 12/01/2018  .   Total Discharge time is about 33 minutes  Shon Hale M.D on 12/03/2018 at 3:57 PM  Go to [email or web address] -  for contact info  [facility] - Office  [phone]

## 2018-12-03 NOTE — Progress Notes (Signed)
Subjective: No further bleeding.  HGB stable.  Objective: Vital signs in last 24 hours: Temp:  [98 F (36.7 C)-98.4 F (36.9 C)] 98 F (36.7 C) (04/16 0541) Pulse Rate:  [57-60] 57 (04/16 0541) Resp:  [16-18] 16 (04/16 0541) BP: (124-143)/(43-66) 142/56 (04/16 0541) SpO2:  [98 %-99 %] 98 % (04/16 0541) Last BM Date: 11/30/18  Intake/Output from previous day: 04/15 0701 - 04/16 0700 In: 1656.9 [I.V.:1656.9] Out: -  Intake/Output this shift: Total I/O In: 222 [P.O.:222] Out: -   General appearance: alert and no distress GI: soft, non-tender; bowel sounds normal; no masses,  no organomegaly  Lab Results: Recent Labs    12/01/18 1939 12/02/18 0523 12/03/18 0836  WBC 5.5 4.7 5.4  HGB 8.4* 8.3* 9.1*  HCT 26.6* 26.8* 28.7*  PLT 115* 112* 125*   BMET Recent Labs    12/01/18 0723 12/02/18 0523 12/03/18 0836  NA 141 142 141  K 4.1 3.8 3.2*  CL 113* 112* 109  CO2 21* 23 23  GLUCOSE 77 83 83  BUN 18 9 8   CREATININE 1.14* 1.15* 1.04*  CALCIUM 8.2* 8.5* 8.7*   LFT Recent Labs    12/01/18 0723  PROT 4.6*  ALBUMIN 2.6*  AST 17  ALT 10  ALKPHOS 32*  BILITOT 1.3*   PT/INR No results for input(s): LABPROT, INR in the last 72 hours. Hepatitis Panel No results for input(s): HEPBSAG, HCVAB, HEPAIGM, HEPBIGM in the last 72 hours. C-Diff No results for input(s): CDIFFTOX in the last 72 hours. Fecal Lactopherrin No results for input(s): FECLLACTOFRN in the last 72 hours.  Studies/Results: No results found.  Medications:  Scheduled: . sodium chloride   Intravenous Once  . atorvastatin  20 mg Oral q1800  . levothyroxine  50 mcg Oral Q0600  . [START ON 12/04/2018] pantoprazole  40 mg Intravenous Q12H   Continuous: . sodium chloride 10 mL/hr at 12/03/18 0816  . pantoprozole (PROTONIX) infusion 8 mg/hr (12/03/18 0402)    Assessment/Plan: 1) Probable post-polypectomy bleed. 2) Anemia.   The patient is feeling well.  There is no further evidence of bleeding  and her HGB is stable.  She can be discharged home.  Plan: 1) Okay to [name]/C home. 2) Follow up with Dr. Loreta Ave in 2 weeks. 3) Hold Plavix for 7 days.  LOS: 3 days   [name] 12/03/2018, 2:28 PM

## 2018-12-03 NOTE — TOC Transition Note (Signed)
Transition of Care [facility]) - CM/SW Discharge Note   Patient Details  Name: [name] MRN: [id number] Date of Birth: [birth year]  [facility] CM/SW Contact:  Leone Haven, RN Phone Number: 12/03/2018, 10:33 AM   Clinical Narrative:    From home alone, her PCP is Dr. Ralene Ok she has an apt on 5/6 at 10:15 per patient. She has no problem getting her medications, she has transport at dc, she has no DME at home and has no need for any, she uses [facility] as preferred pharmacy.    Final next level of care: Home/Self Care Barriers to Discharge: No Barriers Identified   Patient Goals and CMS Choice Patient states their goals for this hospitalization and ongoing recovery are:: (go home and get better)   Choice offered to / list presented to : NA  Discharge Placement                       Discharge Plan and Services In-house Referral: NA Discharge Planning Services: CM Consult Post Acute Care Choice: NA          DME Arranged: N/A DME Agency: NA HH Arranged: NA HH Agency: NA   Social Determinants of Health (SDOH) Interventions     Readmission Risk Interventions Readmission Risk Prevention Plan 12/03/2018  Post Dischage Appt Complete  Medication Screening Complete  Transportation Screening Complete  Some recent data might be hidden

## 2020-08-19 IMAGING — CT CT ABDOMEN AND PELVIS WITH CONTRAST
2 of 5 series · 17 of 46 positions shown, 19 images · IV contrast (omnipaque)
Comparison: None

CLINICAL DATA: Abdominal pain, suspected diverticulitis

EXAM:
CT ABDOMEN AND PELVIS WITH CONTRAST
TECHNIQUE: Multidetector CT imaging of the abdomen and pelvis was performed
using the standard protocol following bolus administration of
intravenous contrast. Sagittal and coronal MPR images reconstructed
from axial data set.
CONTRAST:  80mL OMNIPAQUE IOHEXOL 300 MG/ML SOLN IV. Dilute oral
contrast.

[Series 3: a/p w/ 5mm · axial · 0.70mm/px · z∈[-307,+13]mm · 14 of 72 slices shown, 16 images]
[im 4/72  soft-tissue]
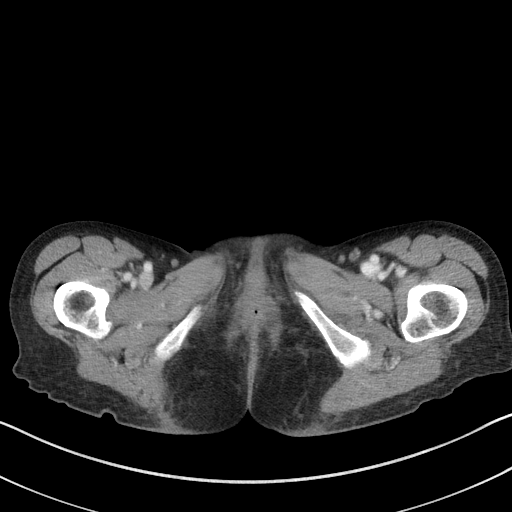
[im 4/72  bone]
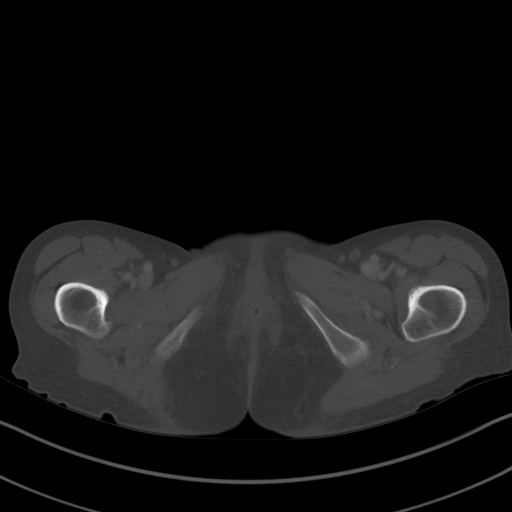
[im 11/72  soft-tissue]
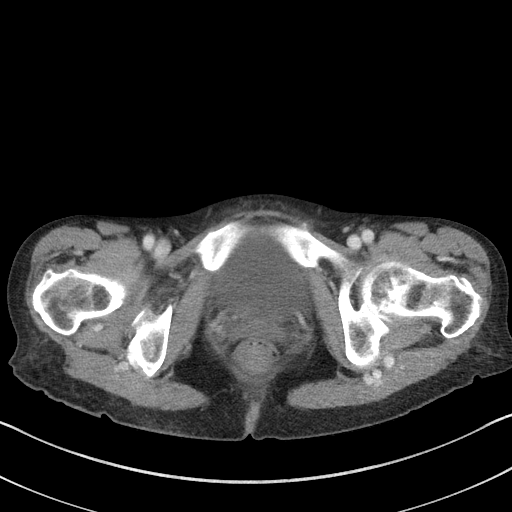
[im 15/72  soft-tissue]
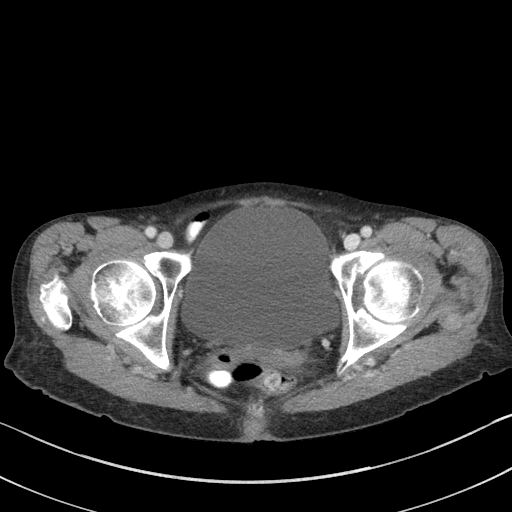
[im 18/72  soft-tissue]
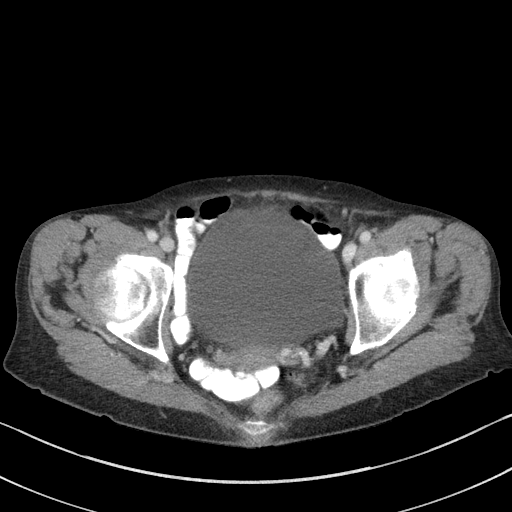
[im 25/72  soft-tissue]
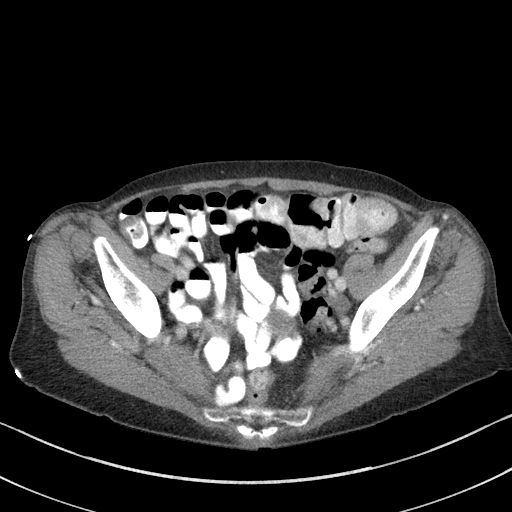
[im 29/72  soft-tissue]
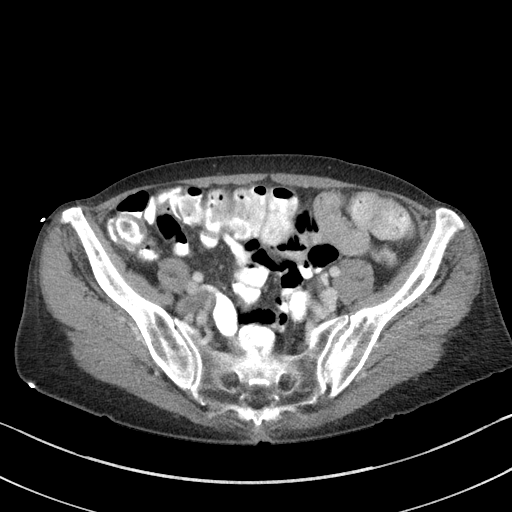
[im 32/72  soft-tissue]
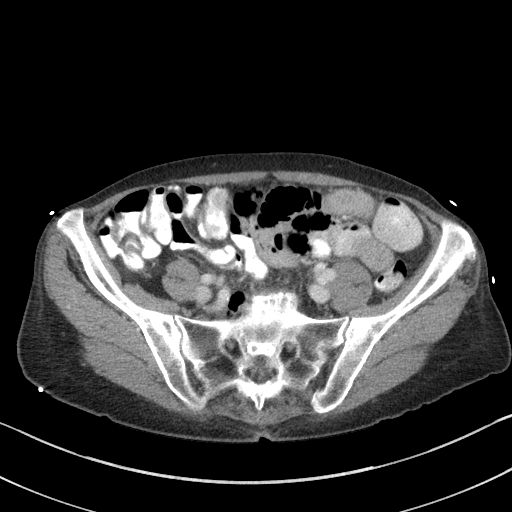
[im 40/72  soft-tissue]
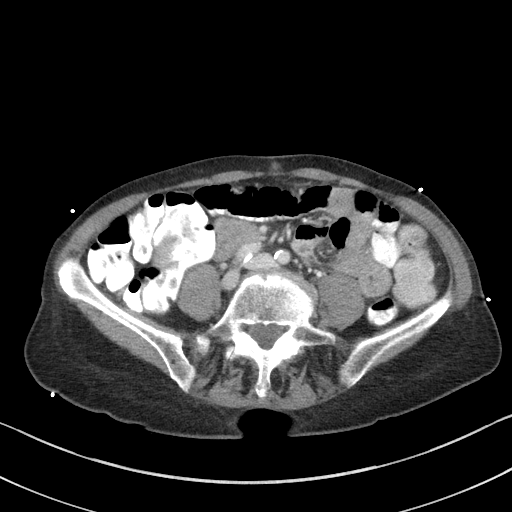
[im 43/72  soft-tissue]
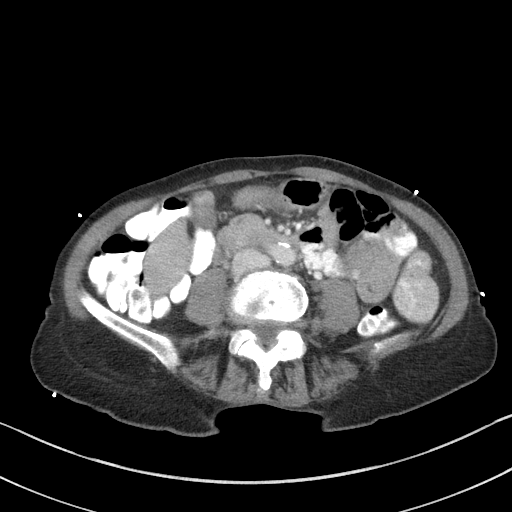
[im 43/72  bone]
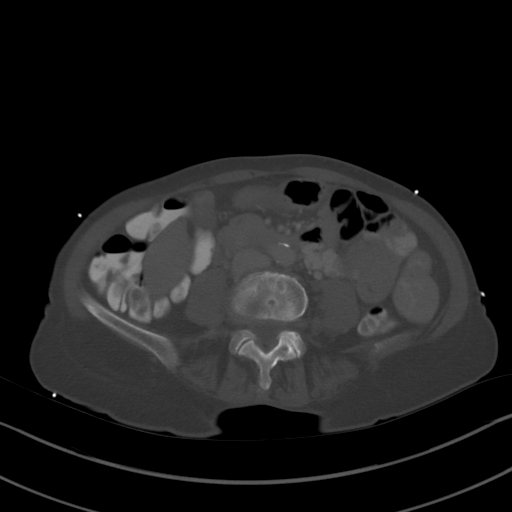
[im 47/72  soft-tissue]
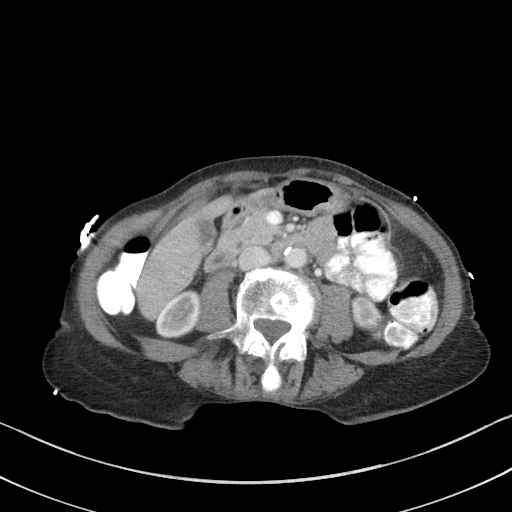
[im 54/72  soft-tissue]
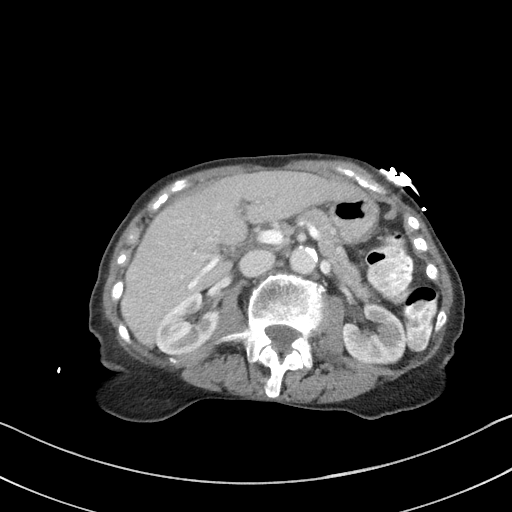
[im 57/72  soft-tissue]
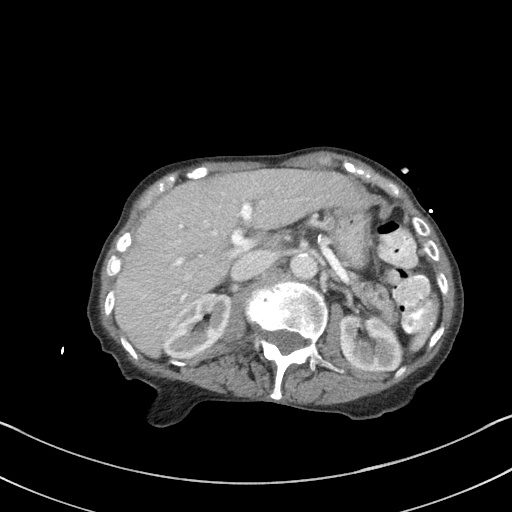
[im 61/72  soft-tissue]
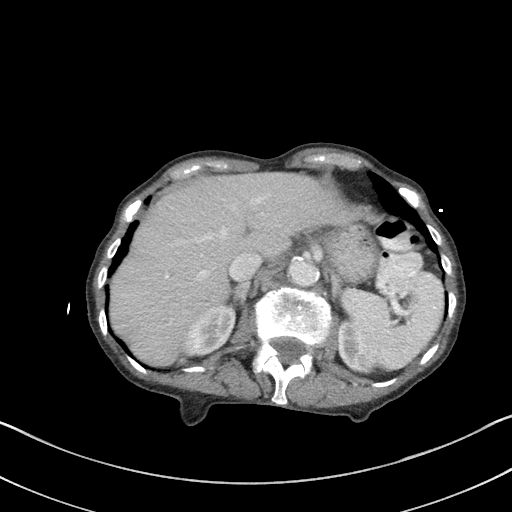
[im 68/72  soft-tissue]
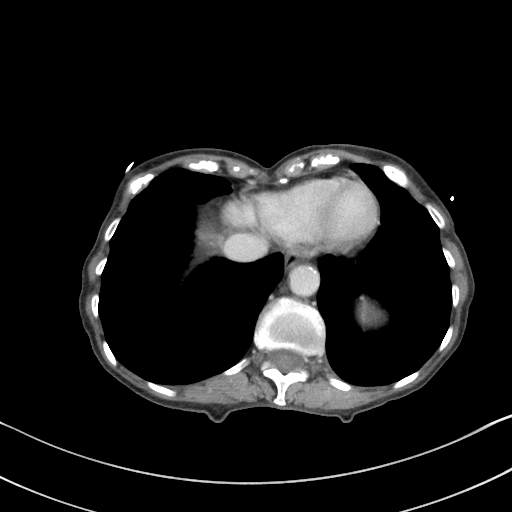

[Series 6: a/p w/ cor · coronal · 0.66mm/px · 3 of 107 slices shown]
[im 36/107  soft-tissue]
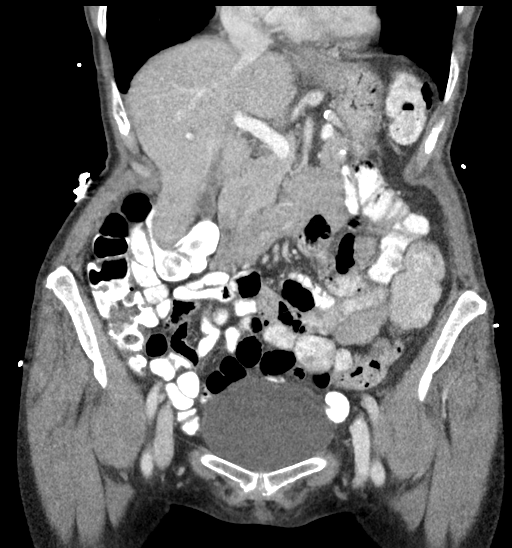
[im 48/107  soft-tissue]
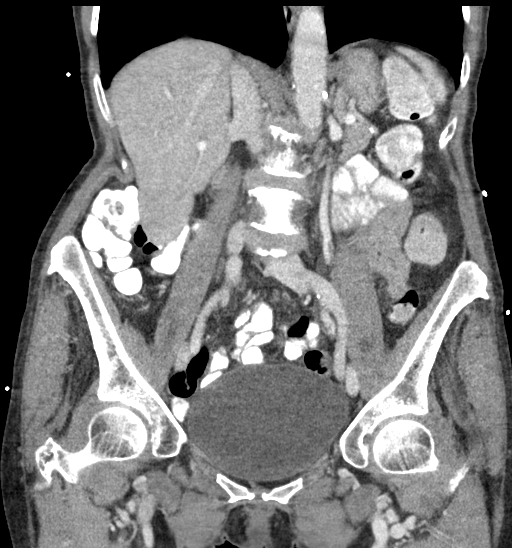
[im 59/107  soft-tissue]
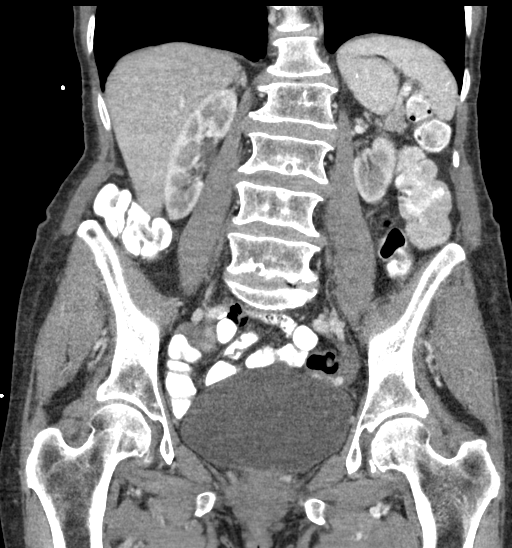

[17 of 46 positions shown; findings below may reference images not displayed]

FINDINGS: Lower chest: Probable tiny calcified granuloma RIGHT lung base image
10.

Hepatobiliary: Gallbladder and liver normal appearance

Pancreas: Normal appearance

Spleen: Calcified granulomata within spleen.  No focal mass lesion.

Adrenals/Urinary Tract: Adrenal glands, kidneys, ureters, and mildly
distended bladder otherwise normal appearance.

Stomach/Bowel: Normal appendix. Stomach and bowel loops normal
appearance

Vascular/Lymphatic: Atherosclerotic calcifications aorta and iliac
arteries without aneurysm. No adenopathy.

Reproductive: Atrophic uterus and ovaries

Other: No free air free fluid. No hernia or acute inflammatory
process.

Musculoskeletal: Bones demineralized.
IMPRESSION: No acute intra-abdominal or intrapelvic abnormalities.
